# Patient Record
Sex: Male | Born: 1965
Health system: Southern US, Community
[De-identification: ages and names within clinical notes are randomized; demographics above are authoritative.]

## PROBLEM LIST (undated history)

## (undated) DIAGNOSIS — F329 Major depressive disorder, single episode, unspecified: Secondary | ICD-10-CM

## (undated) DIAGNOSIS — E1140 Type 2 diabetes mellitus with diabetic neuropathy, unspecified: Secondary | ICD-10-CM

## (undated) DIAGNOSIS — E785 Hyperlipidemia, unspecified: Secondary | ICD-10-CM

## (undated) DIAGNOSIS — E119 Type 2 diabetes mellitus without complications: Secondary | ICD-10-CM

## (undated) DIAGNOSIS — I10 Essential (primary) hypertension: Secondary | ICD-10-CM

## (undated) DIAGNOSIS — F419 Anxiety disorder, unspecified: Secondary | ICD-10-CM

## (undated) DIAGNOSIS — F32A Depression, unspecified: Secondary | ICD-10-CM

## (undated) HISTORY — DX: Hyperlipidemia, unspecified: E78.5

## (undated) HISTORY — DX: Type 2 diabetes mellitus with diabetic neuropathy, unspecified: E11.40

## (undated) HISTORY — DX: Major depressive disorder, single episode, unspecified: F32.9

## (undated) HISTORY — DX: Anxiety disorder, unspecified: F41.9

## (undated) HISTORY — DX: Depression, unspecified: F32.A

## (undated) HISTORY — DX: Type 2 diabetes mellitus without complications: E11.9

## (undated) HISTORY — DX: Essential (primary) hypertension: I10

---

## 2010-04-13 ENCOUNTER — Encounter
Admission: RE | Admit: 2010-04-13 | Discharge: 2010-04-13 | Payer: Self-pay | Source: Home / Self Care | Attending: Internal Medicine | Admitting: Internal Medicine

## 2013-04-10 ENCOUNTER — Ambulatory Visit: Payer: Self-pay | Admitting: *Deleted

## 2016-07-20 ENCOUNTER — Encounter: Payer: Self-pay | Admitting: Primary Care

## 2016-07-20 ENCOUNTER — Ambulatory Visit (INDEPENDENT_AMBULATORY_CARE_PROVIDER_SITE_OTHER): Payer: BLUE CROSS/BLUE SHIELD | Admitting: Primary Care

## 2016-07-20 VITALS — BP 134/74 | HR 93 | Temp 97.9°F | Ht 72.0 in | Wt 307.8 lb

## 2016-07-20 DIAGNOSIS — E785 Hyperlipidemia, unspecified: Secondary | ICD-10-CM | POA: Diagnosis not present

## 2016-07-20 DIAGNOSIS — I10 Essential (primary) hypertension: Secondary | ICD-10-CM | POA: Insufficient documentation

## 2016-07-20 DIAGNOSIS — M7500 Adhesive capsulitis of unspecified shoulder: Secondary | ICD-10-CM

## 2016-07-20 DIAGNOSIS — M7502 Adhesive capsulitis of left shoulder: Secondary | ICD-10-CM

## 2016-07-20 DIAGNOSIS — F329 Major depressive disorder, single episode, unspecified: Secondary | ICD-10-CM

## 2016-07-20 DIAGNOSIS — F419 Anxiety disorder, unspecified: Secondary | ICD-10-CM | POA: Diagnosis not present

## 2016-07-20 DIAGNOSIS — E119 Type 2 diabetes mellitus without complications: Secondary | ICD-10-CM

## 2016-07-20 DIAGNOSIS — K589 Irritable bowel syndrome without diarrhea: Secondary | ICD-10-CM | POA: Insufficient documentation

## 2016-07-20 DIAGNOSIS — K580 Irritable bowel syndrome with diarrhea: Secondary | ICD-10-CM | POA: Diagnosis not present

## 2016-07-20 DIAGNOSIS — F32A Depression, unspecified: Secondary | ICD-10-CM | POA: Insufficient documentation

## 2016-07-20 DIAGNOSIS — E1165 Type 2 diabetes mellitus with hyperglycemia: Secondary | ICD-10-CM | POA: Insufficient documentation

## 2016-07-20 HISTORY — DX: Adhesive capsulitis of unspecified shoulder: M75.00

## 2016-07-20 LAB — COMPREHENSIVE METABOLIC PANEL
ALBUMIN: 4.3 g/dL (ref 3.5–5.2)
ALK PHOS: 66 U/L (ref 39–117)
ALT: 26 U/L (ref 0–53)
AST: 17 U/L (ref 0–37)
BILIRUBIN TOTAL: 0.6 mg/dL (ref 0.2–1.2)
BUN: 17 mg/dL (ref 6–23)
CALCIUM: 9.1 mg/dL (ref 8.4–10.5)
CO2: 25 mEq/L (ref 19–32)
CREATININE: 0.92 mg/dL (ref 0.40–1.50)
Chloride: 101 mEq/L (ref 96–112)
GFR: 91.96 mL/min (ref >60.00)
Glucose, Bld: 332 mg/dL — ABNORMAL HIGH (ref 70–99)
Potassium: 3.9 mEq/L (ref 3.5–5.1)
SODIUM: 134 meq/L — AB (ref 135–145)
TOTAL PROTEIN: 7.4 g/dL (ref 6.0–8.3)

## 2016-07-20 LAB — LIPID PANEL
CHOLESTEROL: 170 mg/dL (ref 0–200)
HDL: 37.1 mg/dL — ABNORMAL LOW (ref >39.00)
NonHDL: 133.35
Total CHOL/HDL Ratio: 5
Triglycerides: 255 mg/dL — ABNORMAL HIGH (ref 0.0–149.0)
VLDL: 51 mg/dL — ABNORMAL HIGH (ref 0.0–40.0)

## 2016-07-20 LAB — HEMOGLOBIN A1C: HEMOGLOBIN A1C: 11.5 % — AB (ref 4.6–6.5)

## 2016-07-20 LAB — LDL CHOLESTEROL, DIRECT: LDL DIRECT: 90 mg/dL

## 2016-07-20 MED ORDER — FLUOXETINE HCL 40 MG PO CAPS: 40.0000 mg | ORAL_CAPSULE | Freq: Every day | ORAL | 3 refills | Status: DC

## 2016-07-20 MED ORDER — LISINOPRIL 10 MG PO TABS: 10.0000 mg | ORAL_TABLET | Freq: Every day | ORAL | 3 refills | Status: DC

## 2016-07-20 NOTE — Assessment & Plan Note (Signed)
Check A1C today, suspect to be above goal based off of readings. Will likely increase Metformin and add in Glipizide, may need to consider Lantus.

## 2016-07-20 NOTE — Progress Notes (Signed)
Pre visit review using our clinic review tool, if applicable. No additional management support is needed unless otherwise documented below in the visit note. 

## 2016-07-20 NOTE — Assessment & Plan Note (Signed)
Check lipids today. Will send in refill of simvastatin if lipids stable.

## 2016-07-20 NOTE — Progress Notes (Signed)
Subjective:    Patient ID: Scott Rice, male    DOB: [birth year], 51 y.o.   MRN: [id number]  HPI  [name] is a 51 year old male who presents today to establish care and discuss the problems mentioned below. Will obtain old records.  1) Anxiety and Depression: Currently managed on fluoxetine 40 mg, trazodone 50 mg as needed. He's also managed on Lamictal 100 mg that he's been taking as needed for anxiety during eye procedures for cataracts. He's not taking the Lamictal daily. This was prescribed by his prior PCP.  2) Essential Hypertension/Renal Protection: Currently managed on lisinopril 10 mg. His BP in the office is 134/74. He checks his blood pressure at home and gets readings of 130/80's.   3) Hyperlipidemia: Currently managed on Simvastatin 10 mg. His last cholesterol check was in December 2017 which was normal.  4) Type 2 Diabetes: Diagnosed several years ago. Currently managed on Metformin 500 mg BID. His last A1C was 9.2 in December 2017 which was a decrease from 11 several months prior. He's tried [place]Farxiza with improvement, but this was too costly. He was also managed on Victoza, and another injectable pen without improvement and/or intolerable side effects. Januvia was ineffective. He's never used insulin, glipizide.  He checks his sugars two-three times daily. His fasting sugars are running in the 250's, his post prandial sugars are running mid-high 300's. He had a cortisone injection was several weeks ago for frozen shoulder.  5) Shoulder Pain: Currently managed on Norco and Robaxin. [facility] frozen shoulder of his left side, Dr. Rennis ChrisSupple. He's been mostly taking Aleve and the methocarbamol, using Norco very sparingly.  6) IBS: Diarrhea type. Symptoms include generalized abdominal cramping with diarrhea that isn't always correlated to meals. Currently managed on Vibrizi which he has samples left over from his prior PCP's office. He's  noticed slight improvement with the Vibrizi. He had a colonoscopy in 2004.   Review of Systems  Constitutional: Negative for unexpected weight change.  Eyes: Negative for visual disturbance.  Respiratory: Negative for shortness of breath.   Cardiovascular: Negative for chest pain.  Gastrointestinal:       IBS  Endocrine: Negative for polyuria.  Musculoskeletal: Positive for arthralgias.  Skin: Negative for color change.  Neurological: Positive for numbness. Negative for dizziness and headaches.  Psychiatric/Behavioral: Negative for suicidal ideas.       Feels well managed on Prozac       No past medical history on file.   Social History   Social History  . Marital status: Married    Spouse name: N/A  . Number of children: N/A  . Years of education: N/A   Occupational History  . Not on file.   Social History Main Topics  . Smoking status: Former Games developermoker  . Smokeless tobacco: Never Used     Comment: quit 40 years ago  . Alcohol use No  . Drug use: No  . Sexual activity: Not on file   Other Topics Concern  . Not on file   Social History Narrative   Married.   No children.   Works as an Event organiserindependent contractor.   Enjoys watching movies, spending time with family.     No past surgical history on file.  No family history on file.  No Known Allergies  No current outpatient prescriptions on file prior to visit.   No current facility-administered medications on file prior to visit.     BP 134/74  Pulse 93   Temp 97.9 F (36.6 C) (Oral)   Ht 6' (1.829 m)   Wt (!) 307 lb 12 oz (139.6 kg)   SpO2 97%   BMI 41.74 kg/m    Objective:   Physical Exam  Constitutional: He is oriented to person, place, and time. He appears well-nourished.  Neck: Neck supple.  Cardiovascular: Normal rate and regular rhythm.   Pulmonary/Chest: Effort normal and breath sounds normal. He has no wheezes. He has no rales.  Musculoskeletal:       Left shoulder: He exhibits decreased  range of motion and pain.  Neurological: He is alert and oriented to person, place, and time.  Skin: Skin is warm and dry.  Psychiatric: He has a normal mood and affect.          Assessment & Plan:

## 2016-07-20 NOTE — Assessment & Plan Note (Signed)
Taking samples of Vibrizi with some improvement. Recommended colonoscopy given age and symptoms, he declines for now.

## 2016-07-20 NOTE — Assessment & Plan Note (Signed)
Stable on Prozac, uses Trazodone PRN for insomnia. Discussed to stop Lamictal as PRN use is not safe.

## 2016-07-20 NOTE — Assessment & Plan Note (Signed)
[facility]. Discussed to use Norco sparingly and that he will have to get refills from ortho if needed. Continue Robaxin and Aleve PRN.

## 2016-07-20 NOTE — Patient Instructions (Addendum)
Complete lab work prior to leaving today. I will notify you of your results once received.   I sent refills for Prozac and Lisinopril to your pharmacy. Stop Lamictal.  I will send Simvastatin and Metformin once I receive your lab results.  I'll be in touch regarding your next follow up visit.  It was a pleasure to meet you today! Please don't hesitate to call me with any questions. Welcome to [facility]!

## 2016-07-20 NOTE — Assessment & Plan Note (Signed)
Managed on lisinopril 10 mg, also for renal protection.  BP stable today, continue current regimen. Will get records.

## 2016-07-21 ENCOUNTER — Other Ambulatory Visit: Payer: Self-pay | Admitting: Primary Care

## 2016-07-21 DIAGNOSIS — E785 Hyperlipidemia, unspecified: Secondary | ICD-10-CM

## 2016-07-21 DIAGNOSIS — E119 Type 2 diabetes mellitus without complications: Secondary | ICD-10-CM

## 2016-07-21 MED ORDER — METFORMIN HCL 1000 MG PO TABS: 1000.0000 mg | ORAL_TABLET | Freq: Two times a day (BID) | ORAL | 3 refills | Status: DC

## 2016-07-21 MED ORDER — ATORVASTATIN CALCIUM 20 MG PO TABS: 20.0000 mg | ORAL_TABLET | Freq: Every evening | ORAL | 3 refills | Status: DC

## 2016-07-26 ENCOUNTER — Encounter: Payer: Self-pay | Admitting: Primary Care

## 2016-07-26 LAB — HM DIABETES EYE EXAM

## 2016-07-27 ENCOUNTER — Telehealth: Payer: Self-pay | Admitting: Primary Care

## 2016-07-27 NOTE — Telephone Encounter (Signed)
Pt returned your call.  

## 2016-07-27 NOTE — Telephone Encounter (Signed)
Spoken and notified patient of Kate's comments. Patient verbalized understanding. 

## 2016-07-28 ENCOUNTER — Telehealth: Payer: Self-pay | Admitting: Primary Care

## 2016-07-28 DIAGNOSIS — E119 Type 2 diabetes mellitus without complications: Secondary | ICD-10-CM

## 2016-07-28 NOTE — Telephone Encounter (Signed)
Patient returned [name]'s call. °

## 2016-07-29 MED ORDER — GLIPIZIDE 10 MG PO TABS: 10.0000 mg | ORAL_TABLET | Freq: Two times a day (BID) | ORAL | 0 refills | Status: DC

## 2016-07-29 NOTE — Telephone Encounter (Signed)
Message left for patient to return my call.  

## 2016-07-29 NOTE — Telephone Encounter (Signed)
Noted. Rx for Glipizide 10 mg sent into pharmacy. Take 1 tablet by mouth twice daily with a meal. Please have him call if he starts feeling shaky, weak, dizzy while on these pills. Have him notify us with blood sugar readings below 90 on a consistent basis.

## 2016-07-29 NOTE — Telephone Encounter (Signed)
Patient returned [name]'s call about lab results.  Scott Rice is off until Monday. I let patient know Scott Rice's comments.  Patient said he is agreeable to starting Glipizide and the lifestyle change.Patient uses [facility].  I let patient know Scott Rice is out of the office until Tuesday and he said that's fine.  Patient scheduled follow up with Scott Rice on 09/21/16.

## 2016-07-30 NOTE — Telephone Encounter (Signed)
Left detailed msg on VM per HIPAA  

## 2016-09-09 ENCOUNTER — Encounter (INDEPENDENT_AMBULATORY_CARE_PROVIDER_SITE_OTHER): Payer: Self-pay | Admitting: Ophthalmology

## 2016-09-14 ENCOUNTER — Encounter (INDEPENDENT_AMBULATORY_CARE_PROVIDER_SITE_OTHER): Payer: BLUE CROSS/BLUE SHIELD | Admitting: Ophthalmology

## 2016-09-14 DIAGNOSIS — E113392 Type 2 diabetes mellitus with moderate nonproliferative diabetic retinopathy without macular edema, left eye: Secondary | ICD-10-CM | POA: Diagnosis not present

## 2016-09-14 DIAGNOSIS — E11319 Type 2 diabetes mellitus with unspecified diabetic retinopathy without macular edema: Secondary | ICD-10-CM

## 2016-09-14 DIAGNOSIS — E113291 Type 2 diabetes mellitus with mild nonproliferative diabetic retinopathy without macular edema, right eye: Secondary | ICD-10-CM

## 2016-09-14 DIAGNOSIS — H35033 Hypertensive retinopathy, bilateral: Secondary | ICD-10-CM | POA: Diagnosis not present

## 2016-09-14 DIAGNOSIS — H353121 Nonexudative age-related macular degeneration, left eye, early dry stage: Secondary | ICD-10-CM

## 2016-09-14 DIAGNOSIS — I10 Essential (primary) hypertension: Secondary | ICD-10-CM

## 2016-09-14 DIAGNOSIS — H43813 Vitreous degeneration, bilateral: Secondary | ICD-10-CM

## 2016-09-21 ENCOUNTER — Encounter: Payer: Self-pay | Admitting: Primary Care

## 2016-09-21 ENCOUNTER — Ambulatory Visit (INDEPENDENT_AMBULATORY_CARE_PROVIDER_SITE_OTHER): Payer: BLUE CROSS/BLUE SHIELD | Admitting: Primary Care

## 2016-09-21 VITALS — BP 124/76 | HR 100 | Temp 97.8°F | Ht 72.0 in | Wt 306.0 lb

## 2016-09-21 DIAGNOSIS — E785 Hyperlipidemia, unspecified: Secondary | ICD-10-CM

## 2016-09-21 DIAGNOSIS — E119 Type 2 diabetes mellitus without complications: Secondary | ICD-10-CM | POA: Diagnosis not present

## 2016-09-21 LAB — LIPID PANEL
CHOL/HDL RATIO: 3
CHOLESTEROL: 107 mg/dL (ref 0–200)
HDL: 42.2 mg/dL (ref >39.00)
LDL CALC: 36 mg/dL (ref 0–99)
NonHDL: 64.53
Triglycerides: 144 mg/dL (ref 0.0–149.0)
VLDL: 28.8 mg/dL (ref 0.0–40.0)

## 2016-09-21 LAB — BASIC METABOLIC PANEL
BUN: 18 mg/dL (ref 6–23)
CO2: 22 mEq/L (ref 19–32)
Calcium: 9.8 mg/dL (ref 8.4–10.5)
Chloride: 101 mEq/L (ref 96–112)
Creatinine, Ser: 0.95 mg/dL (ref 0.40–1.50)
GFR: 88.56 mL/min (ref >60.00)
Glucose, Bld: 167 mg/dL — ABNORMAL HIGH (ref 70–99)
POTASSIUM: 4.1 meq/L (ref 3.5–5.1)
SODIUM: 137 meq/L (ref 135–145)

## 2016-09-21 MED ORDER — ONETOUCH ULTRASOFT LANCETS MISC: 5 refills | Status: DC

## 2016-09-21 MED ORDER — ONETOUCH ULTRA 2 W/DEVICE KIT: PACK | 0 refills | Status: DC

## 2016-09-21 MED ORDER — BLOOD GLUCOSE MONITOR KIT: PACK | 0 refills | Status: DC

## 2016-09-21 MED ORDER — GLUCOSE BLOOD VI STRP: ORAL_STRIP | 5 refills | Status: DC

## 2016-09-21 MED ORDER — BLOOD GLUCOSE METER KIT: PACK | 0 refills | Status: DC

## 2016-09-21 NOTE — Addendum Note (Signed)
Addended by: Tawnya CrookSAMBATH, Aris Moman on: 09/21/2016 11:21 AM   Modules accepted: Orders

## 2016-09-21 NOTE — Assessment & Plan Note (Signed)
Fasting sugars improved, not quite at goal. Will have him work on diet and exercise over the next month. A1C due in 1 month. If no improvement consider Lantus. If improving consider Jardiance.

## 2016-09-21 NOTE — Patient Instructions (Signed)
Complete lab work prior to leaving today. I will notify you of your results once received.   Continue to work on improving your diet.  Start exercising. You should be getting 150 minutes of moderate intensity exercise weekly.  Ensure you are consuming 64 ounces of water daily.  Schedule a lab only appointment in 1 month to recheck your A1C. Continue Glipizide and Metformin as prescribed.  It was a pleasure to see you today!  Diabetes Mellitus and Food It is important for you to manage your blood sugar (glucose) level. Your blood glucose level can be greatly affected by what you eat. Eating healthier foods in the appropriate amounts throughout the day at about the same time each day will help you control your blood glucose level. It can also help slow or prevent worsening of your diabetes mellitus. Healthy eating may even help you improve the level of your blood pressure and reach or maintain a healthy weight. General recommendations for healthful eating and cooking habits include:  Eating meals and snacks regularly. Avoid going long periods of time without eating to lose weight.  Eating a diet that consists mainly of plant-based foods, such as fruits, vegetables, nuts, legumes, and whole grains.  Using low-heat cooking methods, such as baking, instead of high-heat cooking methods, such as deep frying.  Work with your dietitian to make sure you understand how to use the Nutrition Facts information on food labels. How can food affect me? Carbohydrates Carbohydrates affect your blood glucose level more than any other type of food. Your dietitian will help you determine how many carbohydrates to eat at each meal and teach you how to count carbohydrates. Counting carbohydrates is important to keep your blood glucose at a healthy level, especially if you are using insulin or taking certain medicines for diabetes mellitus. Alcohol Alcohol can cause sudden decreases in blood glucose  (hypoglycemia), especially if you use insulin or take certain medicines for diabetes mellitus. Hypoglycemia can be a life-threatening condition. Symptoms of hypoglycemia (sleepiness, dizziness, and disorientation) are similar to symptoms of having too much alcohol. If your health care provider has given you approval to drink alcohol, do so in moderation and use the following guidelines:  Women should not have more than one drink per day, and men should not have more than two drinks per day. One drink is equal to: ? 12 oz of beer. ? 5 oz of wine. ? 1 oz of hard liquor.  Do not drink on an empty stomach.  Keep yourself hydrated. Have water, diet soda, or unsweetened iced tea.  Regular soda, juice, and other mixers might contain a lot of carbohydrates and should be counted.  What foods are not recommended? As you make food choices, it is important to remember that all foods are not the same. Some foods have fewer nutrients per serving than other foods, even though they might have the same number of calories or carbohydrates. It is difficult to get your body what it needs when you eat foods with fewer nutrients. Examples of foods that you should avoid that are high in calories and carbohydrates but low in nutrients include:  Trans fats (most processed foods list trans fats on the Nutrition Facts label).  Regular soda.  Juice.  Candy.  Sweets, such as cake, pie, doughnuts, and cookies.  Fried foods.  What foods can I eat? Eat nutrient-rich foods, which will nourish your body and keep you healthy. The food you should eat also will depend on several factors,  including:  The calories you need.  The medicines you take.  Your weight.  Your blood glucose level.  Your blood pressure level.  Your cholesterol level.  You should eat a variety of foods, including:  Protein. ? Lean cuts of meat. ? Proteins low in saturated fats, such as fish, egg whites, and beans. Avoid processed  meats.  Fruits and vegetables. ? Fruits and vegetables that may help control blood glucose levels, such as apples, mangoes, and yams.  Dairy products. ? Choose fat-free or low-fat dairy products, such as milk, yogurt, and cheese.  Grains, bread, pasta, and rice. ? Choose whole grain products, such as multigrain bread, whole oats, and brown rice. These foods may help control blood pressure.  Fats. ? Foods containing healthful fats, such as nuts, avocado, olive oil, canola oil, and fish.  Does everyone with diabetes mellitus have the same meal plan? Because every person with diabetes mellitus is different, there is not one meal plan that works for everyone. It is very important that you meet with a dietitian who will help you create a meal plan that is just right for you. This information is not intended to replace advice given to you by your health care provider. Make sure you discuss any questions you have with your health care provider. Document Released: 11/26/2004 Document Revised: 08/07/2015 Document Reviewed: 01/26/2013 Elsevier Interactive Patient Education  2017 [employer].

## 2016-09-21 NOTE — Addendum Note (Signed)
Addended by: Tawnya CrookSAMBATH, Shanteria Laye on: 09/21/2016 05:03 PM   Modules accepted: Orders

## 2016-09-21 NOTE — Progress Notes (Signed)
Subjective:    Patient ID: Scott Rice, male    DOB: [birth year], 51 y.o.   MRN: [id number]  HPI  Scott Rice is a 51 year old male who presents today for follow up.  1) Type 2 Diabetes: A1C of 11.5 on his last visit in early May 2018. Currently managed on Metformin 1000 mg BID and Glipizide 10 mg BID. It was recommended he start Lantus during his last visit but he kindly refused so Glipizide was added. His Metformin was increased to 1000 mg BID.   Since his last visit he's been checking his sugars several times daily. His fasting sugars are running 150-240. He saw his ophthalmologist in May 2018, no diabetic retinopathy. He's been compliant to his Metformin and Glipizide.  He's been working on improvements in his diet by eating baked lean protein, cucumbers, limiting fast food consumption. He recently started using an exercise bike at home.   Wt Readings from Last 3 Encounters:  09/21/16 (!) 306 lb (138.8 kg)  07/20/16 (!) 307 lb 12 oz (139.6 kg)      2) Hyperlipidemia: Previously managed on Simvastatin, now on atorvastatin 20 mg given lipid levels 6 weeks ago. He is due for recheck today and is fasting.     Review of Systems  Constitutional: Negative for fatigue.  Respiratory: Negative for shortness of breath.   Cardiovascular: Negative for chest pain.  Neurological: Negative for dizziness, weakness and numbness.       No past medical history on file.   Social History   Social History  . Marital status: Married    Spouse name: N/A  . Number of children: N/A  . Years of education: N/A   Occupational History  . Not on file.   Social History Main Topics  . Smoking status: Former Games developermoker  . Smokeless tobacco: Never Used     Comment: quit 40 years ago  . Alcohol use No  . Drug use: No  . Sexual activity: Not on file   Other Topics Concern  . Not on file   Social History Narrative   Married.   No children.   Works as an Event organiserindependent contractor.   Enjoys  watching movies, spending time with family.     No past surgical history on file.  No family history on file.  No Known Allergies  Current Outpatient Prescriptions on File Prior to Visit  Medication Sig Dispense Refill  . atorvastatin (LIPITOR) 20 MG tablet Take 1 tablet (20 mg total) by mouth every evening. 90 tablet 3  . B Complex-C (SUPER B COMPLEX PO) Take by mouth 2 (two) times daily.    . Calcium Carbonate-Vitamin D (CALCIUM 600/VITAMIN D PO) Take by mouth daily.    . Eluxadoline (VIBERZI) 100 MG TABS Take by mouth daily as needed.    Marland Kitchen. FLUoxetine (PROZAC) 40 MG capsule Take 1 capsule (40 mg total) by mouth daily. 90 capsule 3  . fluticasone (FLONASE) 50 MCG/ACT nasal spray Place into both nostrils daily as needed for allergies or rhinitis.    Marland Kitchen. glipiZIDE (GLUCOTROL) 10 MG tablet Take 1 tablet (10 mg total) by mouth 2 (two) times daily before a meal. 180 tablet 0  . HYDROcodone-acetaminophen (NORCO/VICODIN) 5-325 MG tablet Take 1 tablet by mouth daily as needed for moderate pain.    Marland Kitchen. lisinopril (PRINIVIL,ZESTRIL) 10 MG tablet Take 1 tablet (10 mg total) by mouth daily. 90 tablet 3  . metFORMIN (GLUCOPHAGE) 1000 MG tablet Take 1 tablet (1,000 mg total)  by mouth 2 (two) times daily with a meal. 180 tablet 3  . methocarbamol (ROBAXIN) 750 MG tablet Take 750 mg by mouth daily.    . traZODone (DESYREL) 50 MG tablet Take 50 mg by mouth at bedtime as needed for sleep.     No current facility-administered medications on file prior to visit.     BP 124/76   Pulse 100   Temp 97.8 F (36.6 C) (Oral)   Ht 6' (1.829 m)   Wt (!) 306 lb (138.8 kg)   SpO2 96%   BMI 41.50 kg/m    Objective:   Physical Exam  Constitutional: He appears well-nourished.  Neck: Neck supple.  Cardiovascular: Normal rate and regular rhythm.   Pulmonary/Chest: Effort normal and breath sounds normal.  Skin: Skin is warm and dry.  Psychiatric: He has a normal mood and affect.          Assessment &  Plan:

## 2016-09-21 NOTE — Assessment & Plan Note (Signed)
Fasting today, check lipids since switching from simvastatin to atorvastatin.

## 2016-09-23 ENCOUNTER — Telehealth: Payer: Self-pay | Admitting: Primary Care

## 2016-09-23 NOTE — Telephone Encounter (Signed)
Patient returned [name]'s call. °

## 2016-09-24 NOTE — Telephone Encounter (Signed)
Spoken and notified patient of Kate's comments. Patient verbalized understanding. 

## 2016-10-19 ENCOUNTER — Telehealth: Payer: Self-pay

## 2016-10-19 NOTE — Telephone Encounter (Signed)
Sounds good. Please schedule him for an office visit.

## 2016-10-19 NOTE — Telephone Encounter (Signed)
Pt left v/m; pt thinks side effects of glipizide; tiredness,discolored stools and SOB. Pt thinks he needs to come in for appt rather than just have blood drawn. Pt request cb.

## 2016-10-20 NOTE — Telephone Encounter (Signed)
Spoken to patient and change the appt on 10/26/2016 to an office visit.

## 2016-10-25 ENCOUNTER — Other Ambulatory Visit: Payer: Self-pay | Admitting: Primary Care

## 2016-10-25 DIAGNOSIS — E119 Type 2 diabetes mellitus without complications: Secondary | ICD-10-CM

## 2016-10-26 ENCOUNTER — Ambulatory Visit: Payer: BLUE CROSS/BLUE SHIELD | Admitting: Primary Care

## 2016-10-28 ENCOUNTER — Encounter: Payer: Self-pay | Admitting: Primary Care

## 2016-10-28 ENCOUNTER — Ambulatory Visit (INDEPENDENT_AMBULATORY_CARE_PROVIDER_SITE_OTHER): Payer: BLUE CROSS/BLUE SHIELD | Admitting: Primary Care

## 2016-10-28 VITALS — BP 120/72 | HR 91 | Temp 98.1°F | Ht 72.0 in | Wt 314.0 lb

## 2016-10-28 DIAGNOSIS — E119 Type 2 diabetes mellitus without complications: Secondary | ICD-10-CM | POA: Diagnosis not present

## 2016-10-28 DIAGNOSIS — Z23 Encounter for immunization: Secondary | ICD-10-CM | POA: Diagnosis not present

## 2016-10-28 LAB — HEMOGLOBIN A1C: HEMOGLOBIN A1C: 7.9 % — AB (ref 4.6–6.5)

## 2016-10-28 NOTE — Patient Instructions (Signed)
Complete lab work prior to leaving today. I will notify you of your results once received.   You were provided with an pneumonia vaccination today. We will repeat this in 5 years.  It is important that you improve your diet. Please limit carbohydrates in the form of white bread, rice, pasta, sweets, fast food, fried food, sugary drinks, etc. Increase your consumption of fresh fruits and vegetables, whole grains, lean protein.  Ensure you are consuming 64 ounces of water daily.  Continue exercising. You should be getting 150 minutes of moderate intensity exercise weekly.  I'll be in touch regarding your next follow up visit once we receive your A1C result . It was a pleasure to see you today!  Diabetes Mellitus and Food It is important for you to manage your blood sugar (glucose) level. Your blood glucose level can be greatly affected by what you eat. Eating healthier foods in the appropriate amounts throughout the day at about the same time each day will help you control your blood glucose level. It can also help slow or prevent worsening of your diabetes mellitus. Healthy eating may even help you improve the level of your blood pressure and reach or maintain a healthy weight. General recommendations for healthful eating and cooking habits include:  Eating meals and snacks regularly. Avoid going long periods of time without eating to lose weight.  Eating a diet that consists mainly of plant-based foods, such as fruits, vegetables, nuts, legumes, and whole grains.  Using low-heat cooking methods, such as baking, instead of high-heat cooking methods, such as deep frying.  Work with your dietitian to make sure you understand how to use the Nutrition Facts information on food labels. How can food affect me? Carbohydrates Carbohydrates affect your blood glucose level more than any other type of food. Your dietitian will help you determine how many carbohydrates to eat at each meal and teach you  how to count carbohydrates. Counting carbohydrates is important to keep your blood glucose at a healthy level, especially if you are using insulin or taking certain medicines for diabetes mellitus. Alcohol Alcohol can cause sudden decreases in blood glucose (hypoglycemia), especially if you use insulin or take certain medicines for diabetes mellitus. Hypoglycemia can be a life-threatening condition. Symptoms of hypoglycemia (sleepiness, dizziness, and disorientation) are similar to symptoms of having too much alcohol. If your health care provider has given you approval to drink alcohol, do so in moderation and use the following guidelines:  Women should not have more than one drink per day, and men should not have more than two drinks per day. One drink is equal to: ? 12 oz of beer. ? 5 oz of wine. ? 1 oz of hard liquor.  Do not drink on an empty stomach.  Keep yourself hydrated. Have water, diet soda, or unsweetened iced tea.  Regular soda, juice, and other mixers might contain a lot of carbohydrates and should be counted.  What foods are not recommended? As you make food choices, it is important to remember that all foods are not the same. Some foods have fewer nutrients per serving than other foods, even though they might have the same number of calories or carbohydrates. It is difficult to get your body what it needs when you eat foods with fewer nutrients. Examples of foods that you should avoid that are high in calories and carbohydrates but low in nutrients include:  Trans fats (most processed foods list trans fats on the Nutrition Facts label).  Regular  soda.  Juice.  Candy.  Sweets, such as cake, pie, doughnuts, and cookies.  Fried foods.  What foods can I eat? Eat nutrient-rich foods, which will nourish your body and keep you healthy. The food you should eat also will depend on several factors, including:  The calories you need.  The medicines you take.  Your  weight.  Your blood glucose level.  Your blood pressure level.  Your cholesterol level.  You should eat a variety of foods, including:  Protein. ? Lean cuts of meat. ? Proteins low in saturated fats, such as fish, egg whites, and beans. Avoid processed meats.  Fruits and vegetables. ? Fruits and vegetables that may help control blood glucose levels, such as apples, mangoes, and yams.  Dairy products. ? Choose fat-free or low-fat dairy products, such as milk, yogurt, and cheese.  Grains, bread, pasta, and rice. ? Choose whole grain products, such as multigrain bread, whole oats, and brown rice. These foods may help control blood pressure.  Fats. ? Foods containing healthful fats, such as nuts, avocado, olive oil, canola oil, and fish.  Does everyone with diabetes mellitus have the same meal plan? Because every person with diabetes mellitus is different, there is not one meal plan that works for everyone. It is very important that you meet with a dietitian who will help you create a meal plan that is just right for you. This information is not intended to replace advice given to you by your health care provider. Make sure you discuss any questions you have with your health care provider. Document Released: 11/26/2004 Document Revised: 08/07/2015 Document Reviewed: 01/26/2013 Elsevier Interactive Patient Education  2017 [employer]

## 2016-10-28 NOTE — Progress Notes (Signed)
Subjective:    Patient ID: Scott Rice, male    DOB: [birth year], 51 y.o.   MRN: [id number]  HPI  Scott Rice is a 51 year old male who presents today for follow up of diabetes.  Current medications include: Glipizide 10 mg BID, Metformin 1000 mg BID. He called our office last week reporting "side effects" including shortness of breath, fatigue, discolored stools. He attributed these effects to Glipizide. He stopped taking the Glipizide last week and has noticed symptoms have resolved. He has noticed decreased sensation to the feet as he stubbed his toe several weeks ago and didn't realize it until he saw bleeding.  He/She is checking his/her blood glucose 2 times daily and is getting readings of low 200's fasting with an average of 180 since coming off of Glipizide.   Last A1C: 11.5 in May 2018 Last Eye Exam: Negative in May 2018 Last Foot Exam:  Due, declines today. Pneumonia Vaccination: Never completed.  ACE/ARB: Lisinopril  Statin: Atorvastatin   Diet currently consists of:  Breakfast: Skips, works night shift Lunch: Best boy, salad Dinner: Best boy, pork, vegetables, rice, corn, broccoli with cheese, green beans Snacks: Vegetables, cheese, peanut butter Desserts: None Beverages: Powerade Zero, Diet Soda, Water  Exercise: He is exercising on his home stationary bike three days weekly for 20 minutes.      Review of Systems  Constitutional: Positive for fatigue.  Eyes: Negative for visual disturbance.  Respiratory: Negative for shortness of breath.   Cardiovascular: Negative for chest pain.  Skin: Negative for color change.  Neurological: Positive for numbness.       No past medical history on file.   Social History   Social History  . Marital status: Married    Spouse name: N/A  . Number of children: N/A  . Years of education: N/A   Occupational History  . Not on file.   Social History Main Topics  . Smoking status: Former Research scientist (life sciences)  .  Smokeless tobacco: Never Used     Comment: quit 40 years ago  . Alcohol use No  . Drug use: No  . Sexual activity: Not on file   Other Topics Concern  . Not on file   Social History Narrative   Married.   No children.   Works as an Chief Executive Officer.   Enjoys watching movies, spending time with family.     No past surgical history on file.  No family history on file.  No Known Allergies  Current Outpatient Prescriptions on File Prior to Visit  Medication Sig Dispense Refill  . atorvastatin (LIPITOR) 20 MG tablet Take 1 tablet (20 mg total) by mouth every evening. 90 tablet 3  . B Complex-C (SUPER B COMPLEX PO) Take by mouth 2 (two) times daily.    . blood glucose meter kit and supplies KIT Dispense based on patient and insurance preference.Use as instructed to test blood sugar 3 times daily. 1 each 0  . Calcium Carbonate-Vitamin D (CALCIUM 600/VITAMIN D PO) Take by mouth daily.    . Eluxadoline (VIBERZI) 100 MG TABS Take by mouth daily as needed.    Marland Kitchen FLUoxetine (PROZAC) 40 MG capsule Take 1 capsule (40 mg total) by mouth daily. 90 capsule 3  . fluticasone (FLONASE) 50 MCG/ACT nasal spray Place into both nostrils daily as needed for allergies or rhinitis.    Marland Kitchen HYDROcodone-acetaminophen (NORCO/VICODIN) 5-325 MG tablet Take 1 tablet by mouth daily as needed for moderate pain.    Marland Kitchen lisinopril (PRINIVIL,ZESTRIL)  10 MG tablet Take 1 tablet (10 mg total) by mouth daily. 90 tablet 3  . metFORMIN (GLUCOPHAGE) 1000 MG tablet Take 1 tablet (1,000 mg total) by mouth 2 (two) times daily with a meal. 180 tablet 3  . methocarbamol (ROBAXIN) 750 MG tablet Take 750 mg by mouth daily.    . traZODone (DESYREL) 50 MG tablet Take 50 mg by mouth at bedtime as needed for sleep.     No current facility-administered medications on file prior to visit.     BP 120/72   Pulse 91   Temp 98.1 F (36.7 C) (Oral)   Ht 6' (1.829 m)   Wt (!) 314 lb (142.4 kg)   SpO2 98%   BMI 42.59 kg/m     Objective:   Physical Exam  Constitutional: He appears well-nourished.  Neck: Neck supple.  Cardiovascular: Normal rate and regular rhythm.   Pulmonary/Chest: Effort normal and breath sounds normal.  Skin: Skin is warm and dry.  Patient refuses a foot exam today, did not evaluate toe nail or feet.          Assessment & Plan:

## 2016-10-28 NOTE — Assessment & Plan Note (Addendum)
Due for repeat A1C today. Long discussion today regarding importance of diabetic diet.  Could not tolerate Glipizide. Recommended Lantus if A1C above goal, do not believe that adding an oral tablet will make much of a difference in A1C reduction, discussed this with patient. If he is not agreeable then consider Jardiance.  Declines foot exam. Managed on ACE and statin. Pneumovax provided today.

## 2016-10-28 NOTE — Addendum Note (Signed)
Addended by: Tawnya CrookSAMBATH, Kynlee Koenigsberg on: 10/28/2016 12:46 PM   Modules accepted: Orders

## 2016-11-02 ENCOUNTER — Telehealth: Payer: Self-pay | Admitting: Primary Care

## 2016-11-02 NOTE — Telephone Encounter (Signed)
Spoken and notified patient of Kate's comments. Patient verbalized understanding. 

## 2016-11-02 NOTE — Telephone Encounter (Signed)
Pt returned your call and is requesting a cb °

## 2016-11-03 ENCOUNTER — Other Ambulatory Visit: Payer: Self-pay | Admitting: Primary Care

## 2016-11-03 DIAGNOSIS — E119 Type 2 diabetes mellitus without complications: Secondary | ICD-10-CM

## 2016-11-03 MED ORDER — SITAGLIPTIN PHOSPHATE 100 MG PO TABS: 100.0000 mg | ORAL_TABLET | Freq: Every day | ORAL | 1 refills | Status: DC

## 2017-02-01 ENCOUNTER — Encounter: Payer: Self-pay | Admitting: Primary Care

## 2017-02-01 ENCOUNTER — Ambulatory Visit (INDEPENDENT_AMBULATORY_CARE_PROVIDER_SITE_OTHER): Payer: Commercial Managed Care - PPO | Admitting: Primary Care

## 2017-02-01 DIAGNOSIS — E119 Type 2 diabetes mellitus without complications: Secondary | ICD-10-CM | POA: Diagnosis not present

## 2017-02-01 DIAGNOSIS — I10 Essential (primary) hypertension: Secondary | ICD-10-CM

## 2017-02-01 DIAGNOSIS — F329 Major depressive disorder, single episode, unspecified: Secondary | ICD-10-CM

## 2017-02-01 DIAGNOSIS — F419 Anxiety disorder, unspecified: Secondary | ICD-10-CM | POA: Diagnosis not present

## 2017-02-01 DIAGNOSIS — F32A Depression, unspecified: Secondary | ICD-10-CM

## 2017-02-01 DIAGNOSIS — E785 Hyperlipidemia, unspecified: Secondary | ICD-10-CM | POA: Diagnosis not present

## 2017-02-01 LAB — HEMOGLOBIN A1C: Hgb A1c MFr Bld: 9.1 % — ABNORMAL HIGH (ref 4.6–6.5)

## 2017-02-01 MED ORDER — ATORVASTATIN CALCIUM 20 MG PO TABS: 20.0000 mg | ORAL_TABLET | Freq: Every evening | ORAL | 3 refills | Status: DC

## 2017-02-01 MED ORDER — FLUOXETINE HCL 40 MG PO CAPS: 40.0000 mg | ORAL_CAPSULE | Freq: Every day | ORAL | 3 refills | Status: DC

## 2017-02-01 MED ORDER — METFORMIN HCL 1000 MG PO TABS: 1000.0000 mg | ORAL_TABLET | Freq: Two times a day (BID) | ORAL | 3 refills | Status: DC

## 2017-02-01 MED ORDER — LISINOPRIL 10 MG PO TABS: 10.0000 mg | ORAL_TABLET | Freq: Every day | ORAL | 3 refills | Status: DC

## 2017-02-01 MED ORDER — SITAGLIPTIN PHOSPHATE 100 MG PO TABS: 100.0000 mg | ORAL_TABLET | Freq: Every day | ORAL | 3 refills | Status: DC

## 2017-02-01 NOTE — Patient Instructions (Signed)
Complete lab work prior to leaving today. I will notify you of your results once received.   Continue to work on improving your diet.  Continue exercising. You should be getting 150 minutes of moderate intensity exercise weekly.  Schedule a follow up visit or physical in 6 months.  It was a pleasure to see you today!  Diabetes Mellitus and Food It is important for you to manage your blood sugar (glucose) level. Your blood glucose level can be greatly affected by what you eat. Eating healthier foods in the appropriate amounts throughout the day at about the same time each day will help you control your blood glucose level. It can also help slow or prevent worsening of your diabetes mellitus. Healthy eating may even help you improve the level of your blood pressure and reach or maintain a healthy weight. General recommendations for healthful eating and cooking habits include:  Eating meals and snacks regularly. Avoid going long periods of time without eating to lose weight.  Eating a diet that consists mainly of plant-based foods, such as fruits, vegetables, nuts, legumes, and whole grains.  Using low-heat cooking methods, such as baking, instead of high-heat cooking methods, such as deep frying.  Work with your dietitian to make sure you understand how to use the Nutrition Facts information on food labels. How can food affect me? Carbohydrates Carbohydrates affect your blood glucose level more than any other type of food. Your dietitian will help you determine how many carbohydrates to eat at each meal and teach you how to count carbohydrates. Counting carbohydrates is important to keep your blood glucose at a healthy level, especially if you are using insulin or taking certain medicines for diabetes mellitus. Alcohol Alcohol can cause sudden decreases in blood glucose (hypoglycemia), especially if you use insulin or take certain medicines for diabetes mellitus. Hypoglycemia can be a  life-threatening condition. Symptoms of hypoglycemia (sleepiness, dizziness, and disorientation) are similar to symptoms of having too much alcohol. If your health care provider has given you approval to drink alcohol, do so in moderation and use the following guidelines:  Women should not have more than one drink per day, and men should not have more than two drinks per day. One drink is equal to: ? 12 oz of beer. ? 5 oz of wine. ? 1 oz of hard liquor.  Do not drink on an empty stomach.  Keep yourself hydrated. Have water, diet soda, or unsweetened iced tea.  Regular soda, juice, and other mixers might contain a lot of carbohydrates and should be counted.  What foods are not recommended? As you make food choices, it is important to remember that all foods are not the same. Some foods have fewer nutrients per serving than other foods, even though they might have the same number of calories or carbohydrates. It is difficult to get your body what it needs when you eat foods with fewer nutrients. Examples of foods that you should avoid that are high in calories and carbohydrates but low in nutrients include:  Trans fats (most processed foods list trans fats on the Nutrition Facts label).  Regular soda.  Juice.  Candy.  Sweets, such as cake, pie, doughnuts, and cookies.  Fried foods.  What foods can I eat? Eat nutrient-rich foods, which will nourish your body and keep you healthy. The food you should eat also will depend on several factors, including:  The calories you need.  The medicines you take.  Your weight.  Your blood glucose  level.  Your blood pressure level.  Your cholesterol level.  You should eat a variety of foods, including:  Protein. ? Lean cuts of meat. ? Proteins low in saturated fats, such as fish, egg whites, and beans. Avoid processed meats.  Fruits and vegetables. ? Fruits and vegetables that may help control blood glucose levels, such as apples,  mangoes, and yams.  Dairy products. ? Choose fat-free or low-fat dairy products, such as milk, yogurt, and cheese.  Grains, bread, pasta, and rice. ? Choose whole grain products, such as multigrain bread, whole oats, and brown rice. These foods may help control blood pressure.  Fats. ? Foods containing healthful fats, such as nuts, avocado, olive oil, canola oil, and fish.  Does everyone with diabetes mellitus have the same meal plan? Because every person with diabetes mellitus is different, there is not one meal plan that works for everyone. It is very important that you meet with a dietitian who will help you create a meal plan that is just right for you. This information is not intended to replace advice given to you by your health care provider. Make sure you discuss any questions you have with your health care provider. Document Released: 11/26/2004 Document Revised: 08/07/2015 Document Reviewed: 01/26/2013 Elsevier Interactive Patient Education  2017 [employer]

## 2017-02-01 NOTE — Assessment & Plan Note (Signed)
Fasting sugars around 150 which is an improvement. Repeat A1C today. Foot exam updated today. Recommended to increase exercise, commended improvement in diet. Follow up in 6 months.

## 2017-02-01 NOTE — Progress Notes (Signed)
Subjective:    Patient ID: Scott Rice, male    DOB: 10-Aug-1965, 51 y.o.   MRN: [id number]  HPI  Scott Rice is a 51 year old male who presents today for follow up.  1) Type 2 Diabetes:   Current medications include: Januvia 100 mg, metformin 1000 mg BID.  He is checking his blood glucose once daily before work at 5:30 am.  He is getting readings of 150's.   Last A1C: 7.9 Last Eye Exam: Completed in May 2018 Last Foot Exam: Due Pneumonia Vaccination: Completed pneumovax in 2018 ACE/ARB: ACE Statin: Atorvastatin   Diet currently consists of:  Breakfast: Skips Lunch: Salad with veggies and protein, baked chicken Dinner: Hamburgers, baked chicken, some fish, broccoli  Snacks: Fruit, nuts, pretzels Desserts: None Beverages: Water, diet soda, unsweet tea, Powerade Zero.  Exercise: He is walking 4-5 times weekly for about 1-3 miles  Wt Readings from Last 3 Encounters:  02/01/17 (!) 301 lb 6.4 oz (136.7 kg)  10/28/16 (!) 314 lb (142.4 kg)  09/21/16 (!) 306 lb (138.8 kg)      2) Hyperlipidemia: Currently managed on atorvastatin 20 mg. Lipid panel in July 2018 with TC of 107, LDL of 36, Trigs of 144.   3) Essential Hypertension: Currently managed on lisinopril 10 mg. He denies chest pain, dizziness, lower extremity edema.  BP Readings from Last 3 Encounters:  02/01/17 120/72  10/28/16 120/72  09/21/16 124/76    4) Anxiety and Depression: Currently managed on fluoxetine 40 mg. Overall doing well, did have to take a "double dose" when his dog died several months ago. Now back down to the original dose. He denies SI/HI.   Review of Systems  Constitutional: Negative for fatigue.  Respiratory: Negative for shortness of breath.   Cardiovascular: Negative for chest pain.  Psychiatric/Behavioral:       Feels well managed on fluoxetine.        No past medical history on file.   Social History   Socioeconomic History  . Marital status: Married    Spouse  name: Not on file  . Number of children: Not on file  . Years of education: Not on file  . Highest education level: Not on file  Social Needs  . Financial resource strain: Not on file  . Food insecurity - worry: Not on file  . Food insecurity - inability: Not on file  . Transportation needs - medical: Not on file  . Transportation needs - non-medical: Not on file  Occupational History  . Not on file  Tobacco Use  . Smoking status: Former Games developermoker  . Smokeless tobacco: Never Used  . Tobacco comment: quit 40 years ago  Substance and Sexual Activity  . Alcohol use: No  . Drug use: No  . Sexual activity: Not on file  Other Topics Concern  . Not on file  Social History Narrative   Married.   No children.   Works as an Event organiserindependent contractor.   Enjoys watching movies, spending time with family.     No past surgical history on file.  No family history on file.  No Known Allergies  Current Outpatient Medications on File Prior to Visit  Medication Sig Dispense Refill  . B Complex-C (SUPER B COMPLEX PO) Take by mouth 2 (two) times daily.    . Calcium Carbonate-Vitamin D (CALCIUM 600/VITAMIN D PO) Take by mouth daily.    . Eluxadoline (VIBERZI) 100 MG TABS Take by mouth daily as needed.    .Marland Kitchen  fluticasone (FLONASE) 50 MCG/ACT nasal spray Place into both nostrils daily as needed for allergies or rhinitis.    . methocarbamol (ROBAXIN) 750 MG tablet Take 750 mg by mouth daily.    . traZODone (DESYREL) 50 MG tablet Take 50 mg by mouth at bedtime as needed for sleep.     No current facility-administered medications on file prior to visit.     BP 120/72   Pulse 83   Temp 98.1 F (36.7 C) (Oral)   Ht 6' (1.829 m)   Wt (!) 301 lb 6.4 oz (136.7 kg)   SpO2 98%   BMI 40.88 kg/m    Objective:   Physical Exam  Constitutional: He appears well-nourished.  Neck: Neck supple.  Cardiovascular: Normal rate and regular rhythm.  Pulmonary/Chest: Effort normal and breath sounds normal.    Skin: Skin is warm and dry.  Psychiatric: He has a normal mood and affect.          Assessment & Plan:

## 2017-02-01 NOTE — Assessment & Plan Note (Signed)
Doing well on current regimen.  Refill sent to pharmacy.

## 2017-02-01 NOTE — Assessment & Plan Note (Signed)
Lipids stable from July 2018, continue atorvastatin.

## 2017-02-11 ENCOUNTER — Telehealth: Payer: Self-pay | Admitting: Primary Care

## 2017-02-11 DIAGNOSIS — E119 Type 2 diabetes mellitus without complications: Secondary | ICD-10-CM

## 2017-02-11 NOTE — Telephone Encounter (Signed)
Patient agreeing to new med for DM.

## 2017-02-11 NOTE — Telephone Encounter (Signed)
Patient would like to try the pill first.

## 2017-02-11 NOTE — Telephone Encounter (Signed)
Copied from CRM [phone]. Topic: Quick Communication - See Telephone Encounter >> Feb 11, 2017 10:00 AM Oneal GroutSebastian, Jennifer S wrote: CRM for notification. See Telephone encounter for:  Patient is agreeable to start new med, not sure of name, for DM 02/11/17.

## 2017-02-11 NOTE — Telephone Encounter (Signed)
Please inquire. Which medication is he agreeable to? Invokana or Insulin? Please review result note from 02/01/17.

## 2017-02-12 MED ORDER — CANAGLIFLOZIN 100 MG PO TABS: 100.0000 mg | ORAL_TABLET | Freq: Every day | ORAL | 0 refills | Status: DC

## 2017-02-12 NOTE — Telephone Encounter (Signed)
Noted. Rx for canagliflozin 100 mg tablets sent to pharmacy. Notify patient that this will cause increased urination. Needs a BMP in one month, needs to be fasting. Please set up. Also needs repeat A1C in 3 months, please schedule a lab only appointment for that time. We will see him in May as scheduled.

## 2017-02-14 NOTE — Telephone Encounter (Signed)
Per DPR, left detail message of Kate's comments for patient to call back. 

## 2017-02-16 NOTE — Telephone Encounter (Signed)
Per DPR, left detail message of Kate's comments for patient to call back. 

## 2017-02-28 NOTE — Telephone Encounter (Signed)
Sending letter with Betti CruzKate's comments and to remind patient to schedule lab appointments.

## 2017-03-24 ENCOUNTER — Ambulatory Visit (INDEPENDENT_AMBULATORY_CARE_PROVIDER_SITE_OTHER): Payer: BLUE CROSS/BLUE SHIELD | Admitting: Ophthalmology

## 2017-04-11 ENCOUNTER — Encounter: Payer: Self-pay | Admitting: Primary Care

## 2017-04-11 ENCOUNTER — Ambulatory Visit (INDEPENDENT_AMBULATORY_CARE_PROVIDER_SITE_OTHER): Payer: Commercial Managed Care - PPO | Admitting: Primary Care

## 2017-04-11 VITALS — BP 124/76 | HR 92 | Temp 98.1°F | Ht 72.0 in | Wt 307.1 lb

## 2017-04-11 DIAGNOSIS — M25562 Pain in left knee: Secondary | ICD-10-CM

## 2017-04-11 DIAGNOSIS — E119 Type 2 diabetes mellitus without complications: Secondary | ICD-10-CM | POA: Diagnosis not present

## 2017-04-11 MED ORDER — GLIPIZIDE ER 10 MG PO TB24: 10.0000 mg | ORAL_TABLET | Freq: Every day | ORAL | 1 refills | Status: DC

## 2017-04-11 MED ORDER — NAPROXEN 500 MG PO TABS
500.0000 mg | ORAL_TABLET | Freq: Two times a day (BID) | ORAL | 0 refills | Status: DC
Start: 2017-04-11 — End: 2017-11-08

## 2017-04-11 NOTE — Progress Notes (Signed)
Subjective:    Patient ID: Scott Rice, male    DOB: 1966-03-09, 52 y.o.   MRN: [id number]  HPI  Scott Rice is a 52 year old male with a history of torn ACL (teenager), obesity who presents today with a chief complaint of knee pain.   His pain is located to the left lateral knee has been present since slipping (without falling) on ice 2 days ago. Since the incident he's been on his knee trying to work, but had to stop due to continued pain. He's been applying and elevating the knee without much improvement.   He denies weakness, his knee giving out. He does experience pain with putting pressure onto his knee and also when bending his knee while sleeping. He's been taking Extra Strength Tylenol and Ibuprofen intermittently with improvement.   Review of Systems  Musculoskeletal:       Left knee pain  Skin: Negative for color change.  Neurological: Negative for weakness and numbness.       No past medical history on file.   Social History   Socioeconomic History  . Marital status: Married    Spouse name: Not on file  . Number of children: Not on file  . Years of education: Not on file  . Highest education level: Not on file  Social Needs  . Financial resource strain: Not on file  . Food insecurity - worry: Not on file  . Food insecurity - inability: Not on file  . Transportation needs - medical: Not on file  . Transportation needs - non-medical: Not on file  Occupational History  . Not on file  Tobacco Use  . Smoking status: Former Games developermoker  . Smokeless tobacco: Never Used  . Tobacco comment: quit 40 years ago  Substance and Sexual Activity  . Alcohol use: No  . Drug use: No  . Sexual activity: Not on file  Other Topics Concern  . Not on file  Social History Narrative   Married.   No children.   Works as an Event organiserindependent contractor.   Enjoys watching movies, spending time with family.     No family history on file.  No Known Allergies  Current Outpatient  Medications on File Prior to Visit  Medication Sig Dispense Refill  . atorvastatin (LIPITOR) 20 MG tablet Take 1 tablet (20 mg total) by mouth every evening. 90 tablet 3  . B Complex-C (SUPER B COMPLEX PO) Take by mouth 2 (two) times daily.    . Calcium Carbonate-Vitamin D (CALCIUM 600/VITAMIN D PO) Take by mouth daily.    . Eluxadoline (VIBERZI) 100 MG TABS Take by mouth daily as needed.    Marland Kitchen. FLUoxetine (PROZAC) 40 MG capsule Take 1 capsule (40 mg total) by mouth daily. 90 capsule 3  . fluticasone (FLONASE) 50 MCG/ACT nasal spray Place into both nostrils daily as needed for allergies or rhinitis.    Marland Kitchen. lisinopril (PRINIVIL,ZESTRIL) 10 MG tablet Take 1 tablet (10 mg total) by mouth daily. 90 tablet 3  . metFORMIN (GLUCOPHAGE) 1000 MG tablet Take 1 tablet (1,000 mg total) by mouth 2 (two) times daily with a meal. 180 tablet 3  . methocarbamol (ROBAXIN) 750 MG tablet Take 750 mg by mouth daily.    . sitaGLIPtin (JANUVIA) 100 MG tablet Take 1 tablet (100 mg total) by mouth daily. 90 tablet 3  . traZODone (DESYREL) 50 MG tablet Take 50 mg by mouth at bedtime as needed for sleep.    . canagliflozin (INVOKANA) 100  MG TABS tablet Take 1 tablet (100 mg total) by mouth daily before breakfast. (Patient not taking: Reported on 04/11/2017) 90 tablet 0   No current facility-administered medications on file prior to visit.     BP 124/76   Pulse 92   Temp 98.1 F (36.7 C) (Oral)   Ht 6' (1.829 m)   Wt (!) 307 lb 1.9 oz (139.3 kg)   SpO2 98%   BMI 41.65 kg/m    Objective:   Physical Exam  Constitutional: He appears well-nourished.  Cardiovascular: Normal rate.  Pulmonary/Chest: Effort normal.  Musculoskeletal:       Left knee: He exhibits normal range of motion, no swelling, no deformity, no erythema and no LCL laxity. Tenderness found. Lateral joint line and LCL tenderness noted.       Legs: Skin: Skin is warm and dry. No erythema.          Assessment & Plan:  Acute Knee Pain:  Since  slipping (not falling) on ice 2 days ago. Exam today without evidence of deformity or torn ligament, he can bare weight. Will have him purchase knee sleeve for support, rest, ice, elevate. Rx for Naproxen course sent to pharmacy, okay to alternate with Tylenol. Knee exercises/stretching provided. Work note provided. Consider imaging/PT if no improvement.  Doreene Nest, NP

## 2017-04-11 NOTE — Patient Instructions (Signed)
Start naproxen 500 mg twice daily with a snack.  Okay to take Tylenol in between if needed, don't exceed 3000 mg in 24 hours.  Purchase a knee sleeve/brace for support to the knee.  Continue to elevate and ice your knee if this helps.  Work hard on [employer].   Tell the ladies to schedule you for a lab appointment after February 20th and then an office visit 1-2 days later for diabetes check.  It was a pleasure to see you today!   Knee Exercises Ask your health care provider which exercises are safe for you. Do exercises exactly as told by your health care provider and adjust them as directed. It is normal to feel mild stretching, pulling, tightness, or discomfort as you do these exercises, but you should stop right away if you feel sudden pain or your pain gets worse.Do not begin these exercises until told by your health care provider. STRETCHING AND RANGE OF MOTION EXERCISES These exercises warm up your muscles and joints and improve the movement and flexibility of your knee. These exercises also help to relieve pain, numbness, and tingling. Exercise A: Knee Extension, Prone 1. Lie on your abdomen on a bed. 2. Place your left / right knee just beyond the edge of the surface so your knee is not on the bed. You can put a towel under your left / right thigh just above your knee for comfort. 3. Relax your leg muscles and allow gravity to straighten your knee. You should feel a stretch behind your left / right knee. 4. Hold this position for __________ seconds. 5. Scoot up so your knee is supported between repetitions. Repeat __________ times. Complete this stretch __________ times a day. Exercise B: Knee Flexion, Active  1. Lie on your back with both knees straight. If this causes back discomfort, bend your left / right knee so your foot is flat on the floor. 2. Slowly slide your left / right heel back toward your buttocks until you feel a gentle stretch in the front of your knee or  thigh. 3. Hold this position for __________ seconds. 4. Slowly slide your left / right heel back to the starting position. Repeat __________ times. Complete this exercise __________ times a day. Exercise C: Quadriceps, Prone  1. Lie on your abdomen on a firm surface, such as a bed or padded floor. 2. Bend your left / right knee and hold your ankle. If you cannot reach your ankle or pant leg, loop a belt around your foot and grab the belt instead. 3. Gently pull your heel toward your buttocks. Your knee should not slide out to the side. You should feel a stretch in the front of your thigh and knee. 4. Hold this position for __________ seconds. Repeat __________ times. Complete this stretch __________ times a day. Exercise D: Hamstring, Supine 1. Lie on your back. 2. Loop a belt or towel over the ball of your left / right foot. The ball of your foot is on the walking surface, right under your toes. 3. Straighten your left / right knee and slowly pull on the belt to raise your leg until you feel a gentle stretch behind your knee. ? Do not let your left / right knee bend while you do this. ? Keep your other leg flat on the floor. 4. Hold this position for __________ seconds. Repeat __________ times. Complete this stretch __________ times a day. STRENGTHENING EXERCISES These exercises build strength and endurance in your knee.  Endurance is the ability to use your muscles for a long time, even after they get tired. Exercise E: Quadriceps, Isometric  1. Lie on your back with your left / right leg extended and your other knee bent. Put a rolled towel or small pillow under your knee if told by your health care provider. 2. Slowly tense the muscles in the front of your left / right thigh. You should see your kneecap slide up toward your hip or see increased dimpling just above the knee. This motion will push the back of the knee toward the floor. 3. For __________ seconds, keep the muscle as tight as  you can without increasing your pain. 4. Relax the muscles slowly and completely. Repeat __________ times. Complete this exercise __________ times a day. Exercise F: Straight Leg Raises - Quadriceps 1. Lie on your back with your left / right leg extended and your other knee bent. 2. Tense the muscles in the front of your left / right thigh. You should see your kneecap slide up or see increased dimpling just above the knee. Your thigh may even shake a bit. 3. Keep these muscles tight as you raise your leg 4-6 inches (10-15 cm) off the floor. Do not let your knee bend. 4. Hold this position for __________ seconds. 5. Keep these muscles tense as you lower your leg. 6. Relax your muscles slowly and completely after each repetition. Repeat __________ times. Complete this exercise __________ times a day. Exercise G: Hamstring, Isometric 1. Lie on your back on a firm surface. 2. Bend your left / right knee approximately __________ degrees. 3. Dig your left / right heel into the surface as if you are trying to pull it toward your buttocks. Tighten the muscles in the back of your thighs to dig as hard as you can without increasing any pain. 4. Hold this position for __________ seconds. 5. Release the tension gradually and allow your muscles to relax completely for __________ seconds after each repetition. Repeat __________ times. Complete this exercise __________ times a day. Exercise H: Hamstring Curls  If told by your health care provider, do this exercise while wearing ankle weights. Begin with __________ weights. Then increase the weight by 1 lb (0.5 kg) increments. Do not wear ankle weights that are more than __________. 1. Lie on your abdomen with your legs straight. 2. Bend your left / right knee as far as you can without feeling pain. Keep your hips flat against the floor. 3. Hold this position for __________ seconds. 4. Slowly lower your leg to the starting position.  Repeat __________  times. Complete this exercise __________ times a day. Exercise I: Squats (Quadriceps) 1. Stand in front of a table, with your feet and knees pointing straight ahead. You may rest your hands on the table for balance but not for support. 2. Slowly bend your knees and lower your hips like you are going to sit in a chair. ? Keep your weight over your heels, not over your toes. ? Keep your lower legs upright so they are parallel with the table legs. ? Do not let your hips go lower than your knees. ? Do not bend lower than told by your health care provider. ? If your knee pain increases, do not bend as low. 3. Hold the squat position for __________ seconds. 4. Slowly push with your legs to return to standing. Do not use your hands to pull yourself to standing. Repeat __________ times. Complete this exercise __________ times  a day. Exercise J: Wall Slides (Quadriceps)  1. Lean your back against a smooth wall or door while you walk your feet out 18-24 inches (46-61 cm) from it. 2. Place your feet hip-width apart. 3. Slowly slide down the wall or door until your knees bend __________ degrees. Keep your knees over your heels, not over your toes. Keep your knees in line with your hips. 4. Hold for __________ seconds. Repeat __________ times. Complete this exercise __________ times a day. Exercise K: Straight Leg Raises - Hip Abductors 1. Lie on your side with your left / right leg in the top position. Lie so your head, shoulder, knee, and hip line up. You may bend your bottom knee to help you keep your balance. 2. Roll your hips slightly forward so your hips are stacked directly over each other and your left / right knee is facing forward. 3. Leading with your heel, lift your top leg 4-6 inches (10-15 cm). You should feel the muscles in your outer hip lifting. ? Do not let your foot drift forward. ? Do not let your knee roll toward the ceiling. 4. Hold this position for __________ seconds. 5. Slowly  return your leg to the starting position. 6. Let your muscles relax completely after each repetition. Repeat __________ times. Complete this exercise __________ times a day. Exercise L: Straight Leg Raises - Hip Extensors 1. Lie on your abdomen on a firm surface. You can put a pillow under your hips if that is more comfortable. 2. Tense the muscles in your buttocks and lift your left / right leg about 4-6 inches (10-15 cm). Keep your knee straight as you lift your leg. 3. Hold this position for __________ seconds. 4. Slowly lower your leg to the starting position. 5. Let your leg relax completely after each repetition. Repeat __________ times. Complete this exercise __________ times a day. This information is not intended to replace advice given to you by your health care provider. Make sure you discuss any questions you have with your health care provider. Document Released: 01/13/2005 Document Revised: 11/24/2015 Document Reviewed: 01/05/2015 Elsevier Interactive Patient Education  2018 [employer]

## 2017-04-11 NOTE — Assessment & Plan Note (Signed)
Could not afford Invokana, would like to retry Glipizide. Rx sent to pharmacy.  Discussed the importance of a healthy diet and regular exercise in order for weight loss, and to reduce the risk of any potential medical problems.  Follow up in 1 month for repeat A1C.

## 2017-04-11 NOTE — Addendum Note (Signed)
Addended by: Doreene NestLARK, Reade Trefz K on: 04/11/2017 11:15 AM   Modules accepted: Orders

## 2017-05-11 ENCOUNTER — Other Ambulatory Visit: Payer: Self-pay | Admitting: Primary Care

## 2017-05-11 ENCOUNTER — Ambulatory Visit (INDEPENDENT_AMBULATORY_CARE_PROVIDER_SITE_OTHER): Payer: Commercial Managed Care - PPO | Admitting: Primary Care

## 2017-05-11 ENCOUNTER — Encounter: Payer: Self-pay | Admitting: Primary Care

## 2017-05-11 VITALS — BP 124/74 | HR 101 | Temp 98.0°F | Ht 72.0 in | Wt 320.8 lb

## 2017-05-11 DIAGNOSIS — E119 Type 2 diabetes mellitus without complications: Secondary | ICD-10-CM

## 2017-05-11 LAB — HEMOGLOBIN A1C: HEMOGLOBIN A1C: 9.5 % — AB (ref 4.6–6.5)

## 2017-05-11 LAB — COMPREHENSIVE METABOLIC PANEL
ALBUMIN: 3.9 g/dL (ref 3.5–5.2)
ALT: 27 U/L (ref 0–53)
AST: 20 U/L (ref 0–37)
Alkaline Phosphatase: 74 U/L (ref 39–117)
BILIRUBIN TOTAL: 0.5 mg/dL (ref 0.2–1.2)
BUN: 20 mg/dL (ref 6–23)
CALCIUM: 10 mg/dL (ref 8.4–10.5)
CO2: 27 mEq/L (ref 19–32)
CREATININE: 0.94 mg/dL (ref 0.40–1.50)
Chloride: 101 mEq/L (ref 96–112)
GFR: 89.42 mL/min (ref >60.00)
Glucose, Bld: 286 mg/dL — ABNORMAL HIGH (ref 70–99)
Potassium: 4.5 mEq/L (ref 3.5–5.1)
SODIUM: 136 meq/L (ref 135–145)
Total Protein: 7.1 g/dL (ref 6.0–8.3)

## 2017-05-11 MED ORDER — "INSULIN SYRINGE 30G X 5/16"" 1 ML MISC": 5 refills | Status: DC

## 2017-05-11 MED ORDER — INSULIN NPH ISOPHANE & REGULAR (70-30) 100 UNIT/ML ~~LOC~~ SUSP: SUBCUTANEOUS | 0 refills | Status: DC

## 2017-05-11 NOTE — Progress Notes (Signed)
Subjective:    Patient ID: Scott Rice, male    DOB: 24-Mar-1965, 52 y.o.   MRN: [id number]  HPI  Scott Rice is a 52 year old male who presents today for follow up of diabetes.  Current medications include: Metformin 1000 mg BID, glipizide XL 10 mg, Januvia 100 mg. He just lost his job and will not be able to afford his medications, especially the Januvia.  He is checking his blood glucose 0-3 times daily and is getting readings of: Bedtime: 150's AM Fasting: 140's Sometimes 2 hours a meal: 300's  Last A1C: 9.1 in November 2018 Last Eye Exam: Completed in May 2019 Last Foot Exam: Completed  Pneumonia Vaccination: Completed in 2018 ACE/ARB: Lisinopril  Statin: Atorvastatin  Diet currently consists of:  Breakfast: Skips Lunch: Baked chicken with sugary marinade, pork, rice, some corn Dinner: Baked chicken, pork, some vegetables, rice, salad, [place] Snacks: Nuts, veggies with cheese Desserts: None Beverages: Some water, unsweet tea, diet soda, some juice  Exercise: Not currently exercising.        Review of Systems  Constitutional: Negative for unexpected weight change.  Respiratory: Negative for shortness of breath.   Cardiovascular: Negative for chest pain.  Neurological: Negative for dizziness, weakness and numbness.       No past medical history on file.   Social History   Socioeconomic History  . Marital status: Married    Spouse name: Not on file  . Number of children: Not on file  . Years of education: Not on file  . Highest education level: Not on file  Social Needs  . Financial resource strain: Not on file  . Food insecurity - worry: Not on file  . Food insecurity - inability: Not on file  . Transportation needs - medical: Not on file  . Transportation needs - non-medical: Not on file  Occupational History  . Not on file  Tobacco Use  . Smoking status: Former Games developer  . Smokeless tobacco: Never Used  . Tobacco comment: quit 40 years ago   Substance and Sexual Activity  . Alcohol use: No  . Drug use: No  . Sexual activity: Not on file  Other Topics Concern  . Not on file  Social History Narrative   Married.   No children.   Works as an Event organiser.   Enjoys watching movies, spending time with family.      No family history on file.  No Known Allergies  Current Outpatient Medications on File Prior to Visit  Medication Sig Dispense Refill  . atorvastatin (LIPITOR) 20 MG tablet Take 1 tablet (20 mg total) by mouth every evening. 90 tablet 3  . B Complex-C (SUPER B COMPLEX PO) Take by mouth 2 (two) times daily.    . Calcium Carbonate-Vitamin D (CALCIUM 600/VITAMIN D PO) Take by mouth daily.    . Eluxadoline (VIBERZI) 100 MG TABS Take by mouth daily as needed.    Marland Kitchen FLUoxetine (PROZAC) 40 MG capsule Take 1 capsule (40 mg total) by mouth daily. 90 capsule 3  . fluticasone (FLONASE) 50 MCG/ACT nasal spray Place into both nostrils daily as needed for allergies or rhinitis.    Marland Kitchen glipiZIDE (GLUCOTROL XL) 10 MG 24 hr tablet Take 1 tablet (10 mg total) by mouth daily with breakfast. 30 tablet 1  . lisinopril (PRINIVIL,ZESTRIL) 10 MG tablet Take 1 tablet (10 mg total) by mouth daily. 90 tablet 3  . metFORMIN (GLUCOPHAGE) 1000 MG tablet Take 1 tablet (1,000 mg total) by  mouth 2 (two) times daily with a meal. 180 tablet 3  . methocarbamol (ROBAXIN) 750 MG tablet Take 750 mg by mouth daily.    . naproxen (NAPROSYN) 500 MG tablet Take 1 tablet (500 mg total) by mouth 2 (two) times daily with a meal. 30 tablet 0  . sitaGLIPtin (JANUVIA) 100 MG tablet Take 1 tablet (100 mg total) by mouth daily. 90 tablet 3  . traZODone (DESYREL) 50 MG tablet Take 50 mg by mouth at bedtime as needed for sleep.     No current facility-administered medications on file prior to visit.     BP 124/74   Pulse (!) 101   Temp 98 F (36.7 C) (Oral)   Ht 6' (1.829 m)   Wt (!) 320 lb 12 oz (145.5 kg)   SpO2 98%   BMI 43.50 kg/m     Objective:   Physical Exam  Constitutional: He is oriented to person, place, and time. He appears well-nourished.  Neck: Neck supple.  Cardiovascular: Normal rate and regular rhythm.  Pulmonary/Chest: Effort normal and breath sounds normal. He has no wheezes. He has no rales.  Neurological: He is alert and oriented to person, place, and time.  Skin: Skin is warm and dry.          Assessment & Plan:

## 2017-05-11 NOTE — Assessment & Plan Note (Signed)
Blood glucose levels post prandial too high, although he's not sure if he's waiting 2 hours after a meal before checking. Regardless, A1C pending.  Will likely have to discontinue Januvia, perhaps glipizide as well as he will be without insurance. Discussed to switch medications to [facility], discussed the $4 list.   Consider Relion 70/30 insulin OTC, discussed this with patient today. A1C pending.

## 2017-05-11 NOTE — Patient Instructions (Addendum)
Stop by the lab prior to leaving today. I will notify you of your results once received.   Consider switching to [facility] for your prescriptions.   Communicate with me via My Chart if you cannot afford any of your medications.   Start exercising. You should be getting 150 minutes of moderate intensity exercise weekly.  Continue to work on [employer] as discussed.  Please schedule a follow up appointment in 6 months.   It was a pleasure to see you today!    Knee Exercises Ask your health care provider which exercises are safe for you. Do exercises exactly as told by your health care provider and adjust them as directed. It is normal to feel mild stretching, pulling, tightness, or discomfort as you do these exercises, but you should stop right away if you feel sudden pain or your pain gets worse.Do not begin these exercises until told by your health care provider. STRETCHING AND RANGE OF MOTION EXERCISES These exercises warm up your muscles and joints and improve the movement and flexibility of your knee. These exercises also help to relieve pain, numbness, and tingling. Exercise A: Knee Extension, Prone 1. Lie on your abdomen on a bed. 2. Place your left / right knee just beyond the edge of the surface so your knee is not on the bed. You can put a towel under your left / right thigh just above your knee for comfort. 3. Relax your leg muscles and allow gravity to straighten your knee. You should feel a stretch behind your left / right knee. 4. Hold this position for __________ seconds. 5. Scoot up so your knee is supported between repetitions. Repeat __________ times. Complete this stretch __________ times a day. Exercise B: Knee Flexion, Active  1. Lie on your back with both knees straight. If this causes back discomfort, bend your left / right knee so your foot is flat on the floor. 2. Slowly slide your left / right heel back toward your buttocks until you feel a gentle stretch in the  front of your knee or thigh. 3. Hold this position for __________ seconds. 4. Slowly slide your left / right heel back to the starting position. Repeat __________ times. Complete this exercise __________ times a day. Exercise C: Quadriceps, Prone  1. Lie on your abdomen on a firm surface, such as a bed or padded floor. 2. Bend your left / right knee and hold your ankle. If you cannot reach your ankle or pant leg, loop a belt around your foot and grab the belt instead. 3. Gently pull your heel toward your buttocks. Your knee should not slide out to the side. You should feel a stretch in the front of your thigh and knee. 4. Hold this position for __________ seconds. Repeat __________ times. Complete this stretch __________ times a day. Exercise D: Hamstring, Supine 1. Lie on your back. 2. Loop a belt or towel over the ball of your left / right foot. The ball of your foot is on the walking surface, right under your toes. 3. Straighten your left / right knee and slowly pull on the belt to raise your leg until you feel a gentle stretch behind your knee. ? Do not let your left / right knee bend while you do this. ? Keep your other leg flat on the floor. 4. Hold this position for __________ seconds. Repeat __________ times. Complete this stretch __________ times a day. STRENGTHENING EXERCISES These exercises build strength and endurance in your knee.  Endurance is the ability to use your muscles for a long time, even after they get tired. Exercise E: Quadriceps, Isometric  1. Lie on your back with your left / right leg extended and your other knee bent. Put a rolled towel or small pillow under your knee if told by your health care provider. 2. Slowly tense the muscles in the front of your left / right thigh. You should see your kneecap slide up toward your hip or see increased dimpling just above the knee. This motion will push the back of the knee toward the floor. 3. For __________ seconds, keep  the muscle as tight as you can without increasing your pain. 4. Relax the muscles slowly and completely. Repeat __________ times. Complete this exercise __________ times a day. Exercise F: Straight Leg Raises - Quadriceps 1. Lie on your back with your left / right leg extended and your other knee bent. 2. Tense the muscles in the front of your left / right thigh. You should see your kneecap slide up or see increased dimpling just above the knee. Your thigh may even shake a bit. 3. Keep these muscles tight as you raise your leg 4-6 inches (10-15 cm) off the floor. Do not let your knee bend. 4. Hold this position for __________ seconds. 5. Keep these muscles tense as you lower your leg. 6. Relax your muscles slowly and completely after each repetition. Repeat __________ times. Complete this exercise __________ times a day. Exercise G: Hamstring, Isometric 1. Lie on your back on a firm surface. 2. Bend your left / right knee approximately __________ degrees. 3. Dig your left / right heel into the surface as if you are trying to pull it toward your buttocks. Tighten the muscles in the back of your thighs to dig as hard as you can without increasing any pain. 4. Hold this position for __________ seconds. 5. Release the tension gradually and allow your muscles to relax completely for __________ seconds after each repetition. Repeat __________ times. Complete this exercise __________ times a day. Exercise H: Hamstring Curls  If told by your health care provider, do this exercise while wearing ankle weights. Begin with __________ weights. Then increase the weight by 1 lb (0.5 kg) increments. Do not wear ankle weights that are more than __________. 1. Lie on your abdomen with your legs straight. 2. Bend your left / right knee as far as you can without feeling pain. Keep your hips flat against the floor. 3. Hold this position for __________ seconds. 4. Slowly lower your leg to the starting  position.  Repeat __________ times. Complete this exercise __________ times a day. Exercise I: Squats (Quadriceps) 1. Stand in front of a table, with your feet and knees pointing straight ahead. You may rest your hands on the table for balance but not for support. 2. Slowly bend your knees and lower your hips like you are going to sit in a chair. ? Keep your weight over your heels, not over your toes. ? Keep your lower legs upright so they are parallel with the table legs. ? Do not let your hips go lower than your knees. ? Do not bend lower than told by your health care provider. ? If your knee pain increases, do not bend as low. 3. Hold the squat position for __________ seconds. 4. Slowly push with your legs to return to standing. Do not use your hands to pull yourself to standing. Repeat __________ times. Complete this exercise __________ times  a day. Exercise J: Wall Slides (Quadriceps)  1. Lean your back against a smooth wall or door while you walk your feet out 18-24 inches (46-61 cm) from it. 2. Place your feet hip-width apart. 3. Slowly slide down the wall or door until your knees bend __________ degrees. Keep your knees over your heels, not over your toes. Keep your knees in line with your hips. 4. Hold for __________ seconds. Repeat __________ times. Complete this exercise __________ times a day. Exercise K: Straight Leg Raises - Hip Abductors 1. Lie on your side with your left / right leg in the top position. Lie so your head, shoulder, knee, and hip line up. You may bend your bottom knee to help you keep your balance. 2. Roll your hips slightly forward so your hips are stacked directly over each other and your left / right knee is facing forward. 3. Leading with your heel, lift your top leg 4-6 inches (10-15 cm). You should feel the muscles in your outer hip lifting. ? Do not let your foot drift forward. ? Do not let your knee roll toward the ceiling. 4. Hold this position for  __________ seconds. 5. Slowly return your leg to the starting position. 6. Let your muscles relax completely after each repetition. Repeat __________ times. Complete this exercise __________ times a day. Exercise L: Straight Leg Raises - Hip Extensors 1. Lie on your abdomen on a firm surface. You can put a pillow under your hips if that is more comfortable. 2. Tense the muscles in your buttocks and lift your left / right leg about 4-6 inches (10-15 cm). Keep your knee straight as you lift your leg. 3. Hold this position for __________ seconds. 4. Slowly lower your leg to the starting position. 5. Let your leg relax completely after each repetition. Repeat __________ times. Complete this exercise __________ times a day. This information is not intended to replace advice given to you by your health care provider. Make sure you discuss any questions you have with your health care provider. Document Released: 01/13/2005 Document Revised: 11/24/2015 Document Reviewed: 01/05/2015 Elsevier Interactive Patient Education  2018 [employer].   Diabetes Mellitus and Nutrition When you have diabetes (diabetes mellitus), it is very important to have healthy eating habits because your blood sugar (glucose) levels are greatly affected by what you eat and drink. Eating healthy foods in the appropriate amounts, at about the same times every day, can help you:  Control your blood glucose.  Lower your risk of heart disease.  Improve your blood pressure.  Reach or maintain a healthy weight.  Every person with diabetes is different, and each person has different needs for a meal plan. Your health care provider may recommend that you work with a diet and nutrition specialist (dietitian) to make a meal plan that is best for you. Your meal plan may vary depending on factors such as:  The calories you need.  The medicines you take.  Your weight.  Your blood glucose, blood pressure, and cholesterol  levels.  Your activity level.  Other health conditions you have, such as heart or kidney disease.  How do carbohydrates affect me? Carbohydrates affect your blood glucose level more than any other type of food. Eating carbohydrates naturally increases the amount of glucose in your blood. Carbohydrate counting is a method for keeping track of how many carbohydrates you eat. Counting carbohydrates is important to keep your blood glucose at a healthy level, especially if you use insulin or  take certain oral diabetes medicines. It is important to know how many carbohydrates you can safely have in each meal. This is different for every person. Your dietitian can help you calculate how many carbohydrates you should have at each meal and for snack. Foods that contain carbohydrates include:  Bread, cereal, rice, pasta, and crackers.  Potatoes and corn.  Peas, beans, and lentils.  Milk and yogurt.  Fruit and juice.  Desserts, such as cakes, cookies, ice cream, and candy.  How does alcohol affect me? Alcohol can cause a sudden decrease in blood glucose (hypoglycemia), especially if you use insulin or take certain oral diabetes medicines. Hypoglycemia can be a life-threatening condition. Symptoms of hypoglycemia (sleepiness, dizziness, and confusion) are similar to symptoms of having too much alcohol. If your health care provider says that alcohol is safe for you, follow these guidelines:  Limit alcohol intake to no more than 1 drink per day for nonpregnant women and 2 drinks per day for men. One drink equals 12 oz of beer, 5 oz of wine, or 1 oz of hard liquor.  Do not drink on an empty stomach.  Keep yourself hydrated with water, diet soda, or unsweetened iced tea.  Keep in mind that regular soda, juice, and other mixers may contain a lot of sugar and must be counted as carbohydrates.  What are tips for following this plan? Reading food labels  Start by checking the serving size on the  label. The amount of calories, carbohydrates, fats, and other nutrients listed on the label are based on one serving of the food. Many foods contain more than one serving per package.  Check the total grams (g) of carbohydrates in one serving. You can calculate the number of servings of carbohydrates in one serving by dividing the total carbohydrates by 15. For example, if a food has 30 g of total carbohydrates, it would be equal to 2 servings of carbohydrates.  Check the number of grams (g) of saturated and trans fats in one serving. Choose foods that have low or no amount of these fats.  Check the number of milligrams (mg) of sodium in one serving. Most people should limit total sodium intake to less than 2,300 mg per day.  Always check the nutrition information of foods labeled as "low-fat" or "nonfat". These foods may be higher in added sugar or refined carbohydrates and should be avoided.  Talk to your dietitian to identify your daily goals for nutrients listed on the label. Shopping  Avoid buying canned, premade, or processed foods. These foods tend to be high in fat, sodium, and added sugar.  Shop around the outside edge of the grocery store. This includes fresh fruits and vegetables, bulk grains, fresh meats, and fresh dairy. Cooking  Use low-heat cooking methods, such as baking, instead of high-heat cooking methods like deep frying.  Cook using healthy oils, such as olive, canola, or sunflower oil.  Avoid cooking with butter, cream, or high-fat meats. Meal planning  Eat meals and snacks regularly, preferably at the same times every day. Avoid going long periods of time without eating.  Eat foods high in fiber, such as fresh fruits, vegetables, beans, and whole grains. Talk to your dietitian about how many servings of carbohydrates you can eat at each meal.  Eat 4-6 ounces of lean protein each day, such as lean meat, chicken, fish, eggs, or tofu. 1 ounce is equal to 1 ounce of  meat, chicken, or fish, 1 egg, or 1/4 cup of  tofu.  Eat some foods each day that contain healthy fats, such as avocado, nuts, seeds, and fish. Lifestyle   Check your blood glucose regularly.  Exercise at least 30 minutes 5 or more days each week, or as told by your health care provider.  Take medicines as told by your health care provider.  Do not use any products that contain nicotine or tobacco, such as cigarettes and e-cigarettes. If you need help quitting, ask your health care provider.  Work with a Veterinary surgeon or diabetes educator to identify strategies to manage stress and any emotional and social challenges. What are some questions to ask my health care provider?  Do I need to meet with a diabetes educator?  Do I need to meet with a dietitian?  What number can I call if I have questions?  When are the best times to check my blood glucose? Where to find more information:  American Diabetes Association: diabetes.org/food-and-fitness/food  Academy of Nutrition and Dietetics: https://www.vargas.com/  [employer] of Diabetes and Digestive and Kidney Diseases (NIH): FindJewelers.cz Summary  A healthy meal plan will help you control your blood glucose and maintain a healthy lifestyle.  Working with a diet and nutrition specialist (dietitian) can help you make a meal plan that is best for you.  Keep in mind that carbohydrates and alcohol have immediate effects on your blood glucose levels. It is important to count carbohydrates and to use alcohol carefully. This information is not intended to replace advice given to you by your health care provider. Make sure you discuss any questions you have with your health care provider. Document Released: 11/26/2004 Document Revised: 04/05/2016 Document Reviewed: 04/05/2016 Elsevier Interactive Patient Education  [employer].

## 2017-05-15 ENCOUNTER — Encounter: Payer: Self-pay | Admitting: Primary Care

## 2017-05-30 ENCOUNTER — Encounter: Payer: Self-pay | Admitting: Primary Care

## 2017-06-02 ENCOUNTER — Encounter: Payer: Self-pay | Admitting: Primary Care

## 2017-06-06 ENCOUNTER — Encounter: Payer: Self-pay | Admitting: Primary Care

## 2017-06-22 ENCOUNTER — Encounter: Payer: Self-pay | Admitting: Primary Care

## 2017-07-19 ENCOUNTER — Encounter: Payer: Self-pay | Admitting: Primary Care

## 2017-07-19 DIAGNOSIS — E119 Type 2 diabetes mellitus without complications: Secondary | ICD-10-CM

## 2017-07-21 MED ORDER — GABAPENTIN 100 MG PO CAPS: ORAL_CAPSULE | ORAL | 0 refills | Status: DC

## 2017-07-26 ENCOUNTER — Telehealth: Payer: Self-pay

## 2017-07-26 NOTE — Telephone Encounter (Signed)
called patient in regards to a referral, unable to leave a message on both numbers on file-Lasasha Brophy V Bradi Arbuthnot, RMA

## 2017-07-26 NOTE — Telephone Encounter (Signed)
Called and spoke with patient and his wife. See referral note-Anastasiya V Hopkins, RMA

## 2017-07-26 NOTE — Telephone Encounter (Signed)
  PTs wife returned call,  She states she will be home most of day  call back at [phone]  Or cell  [phone]

## 2017-07-28 ENCOUNTER — Other Ambulatory Visit: Payer: Commercial Managed Care - PPO

## 2017-08-03 ENCOUNTER — Encounter: Payer: Commercial Managed Care - PPO | Admitting: Primary Care

## 2017-08-11 ENCOUNTER — Telehealth: Payer: Self-pay

## 2017-08-11 NOTE — Telephone Encounter (Signed)
Left message for patient to call Zayon Trulson back in regards to a referral and his insurance-Doraine Schexnider V Madelynn Malson, RMA

## 2017-08-22 ENCOUNTER — Other Ambulatory Visit: Payer: Self-pay | Admitting: Primary Care

## 2017-08-22 DIAGNOSIS — E119 Type 2 diabetes mellitus without complications: Secondary | ICD-10-CM

## 2017-08-22 NOTE — Telephone Encounter (Signed)
Ok to refill? Electronically refill request for gabapentin (NEURONTIN) 100 MG capsule  Last prescribed on 07/21/2017  Last seen on 05/11/2017

## 2017-08-23 DIAGNOSIS — E119 Type 2 diabetes mellitus without complications: Secondary | ICD-10-CM

## 2017-08-23 NOTE — Telephone Encounter (Signed)
Will send patient my chart message for an update on gabapentin.

## 2017-08-28 NOTE — Telephone Encounter (Signed)
Patient will notify when needing refills.

## 2017-08-30 MED ORDER — GABAPENTIN 100 MG PO CAPS: ORAL_CAPSULE | ORAL | 0 refills | Status: DC

## 2017-09-01 ENCOUNTER — Telehealth: Payer: Self-pay

## 2017-09-01 NOTE — Telephone Encounter (Signed)
[facility] pt referred to their office but needs last A1C and CMP; faxed 05/11/17 lab results to [phone]; pt is at office now. Faxed as requested.

## 2017-11-08 ENCOUNTER — Ambulatory Visit (INDEPENDENT_AMBULATORY_CARE_PROVIDER_SITE_OTHER)
Admission: RE | Admit: 2017-11-08 | Discharge: 2017-11-08 | Disposition: A | Discharge status: Home / Self Care | Payer: BLUE CROSS/BLUE SHIELD | Source: Ambulatory Visit | Attending: Primary Care | Admitting: Primary Care

## 2017-11-08 ENCOUNTER — Ambulatory Visit: Payer: BLUE CROSS/BLUE SHIELD | Admitting: Primary Care

## 2017-11-08 ENCOUNTER — Encounter: Payer: Self-pay | Admitting: Primary Care

## 2017-11-08 VITALS — BP 128/84 | HR 100 | Temp 98.0°F | Ht 72.0 in | Wt 296.8 lb

## 2017-11-08 DIAGNOSIS — M25562 Pain in left knee: Secondary | ICD-10-CM | POA: Diagnosis not present

## 2017-11-08 DIAGNOSIS — F329 Major depressive disorder, single episode, unspecified: Secondary | ICD-10-CM | POA: Diagnosis not present

## 2017-11-08 DIAGNOSIS — F419 Anxiety disorder, unspecified: Secondary | ICD-10-CM | POA: Diagnosis not present

## 2017-11-08 DIAGNOSIS — E119 Type 2 diabetes mellitus without complications: Secondary | ICD-10-CM

## 2017-11-08 DIAGNOSIS — F32A Depression, unspecified: Secondary | ICD-10-CM

## 2017-11-08 MED ORDER — NAPROXEN 500 MG PO TABS: ORAL_TABLET | ORAL | 0 refills | Status: DC

## 2017-11-08 NOTE — Assessment & Plan Note (Signed)
Following with endocrinology who has added Victoza, Trulicity, and Jardiance. Continue Novolin 70/30 and Metformin. Follow up scheduled for mid September, he will have his endocrinologist send over notes.

## 2017-11-08 NOTE — Progress Notes (Signed)
Subjective:    Patient ID: Scott Rice, male    DOB: 1965-04-05, 52 y.o.   MRN: [id number]  HPI  Scott Rice is a 52 year old male who presents today with a chief complaint of knee pain. Also for follow up. History of torn ACL to left knee years ago.   1) Knee Pain: His pain is located to the left knee under the patella that began 4 days ago. He was walking and slipped on a curb, leaned slightly onto his car which was parked by the curb preventing him from falling. He also noticed immediate swelling. He's used a brace, applied ice, and taken Ibuprofen with little improvement in pain. His pain is worse with standing. He has noticed a significant decrease in swelling.  2) Type 2 Diabetes:   Current medications include: Trulicity, Jardiance, Novolin 70/30, Metformin, Victoza. Gabapentin was added to his regimen several months ago, improved symptoms of neuropathy on this regimen.  He is checking his blood glucose 3 times daily and is getting readings of:  AM Fasting: low 110's  Before lunch: 120's Bedtime: 140's  Last A1C: 9.5 in February 2019, following with endocrinology now. Last Eye Exam: Due, he will schedule  Last Foot Exam: Due in November 2019 Pneumonia Vaccination: Completed in 2018 ACE/ARB: Lisinopril Statin: Lipitor   He is working on his diet by eating out less, has increased his intake of lean protein, increased legumes. He is not exercising regularly. He is following with endocrinology and has a follow up visit scheduled for mid September 2019.  Wt Readings from Last 3 Encounters:  11/08/17 296 lb 12.8 oz (134.6 kg)  05/11/17 (!) 320 lb 12 oz (145.5 kg)  04/11/17 (!) 307 lb 1.9 oz (139.3 kg)    3) Anxiety and Depression: Currently managed on Fluoxetine 40 mg and overall feels well managed. He has experienced increased stress recently since his mother in law moved in with he and his wife. Symptoms include not feeling like getting out of bed, feeling more down. He  also struggles with gambling and is requesting to see a therapist. He denies SI/HI.  Review of Systems  Respiratory: Negative for shortness of breath.   Cardiovascular: Negative for chest pain.  Musculoskeletal: Positive for arthralgias.  Skin: Negative for color change.  Neurological: Negative for numbness.  Psychiatric/Behavioral:       See HPI       No past medical history on file.   Social History   Socioeconomic History  . Marital status: Married    Spouse name: Not on file  . Number of children: Not on file  . Years of education: Not on file  . Highest education level: Not on file  Occupational History  . Not on file  Social Needs  . Financial resource strain: Not on file  . Food insecurity:    Worry: Not on file    Inability: Not on file  . Transportation needs:    Medical: Not on file    Non-medical: Not on file  Tobacco Use  . Smoking status: Former Games developer  . Smokeless tobacco: Never Used  . Tobacco comment: quit 40 years ago  Substance and Sexual Activity  . Alcohol use: No  . Drug use: No  . Sexual activity: Not on file  Lifestyle  . Physical activity:    Days per week: Not on file    Minutes per session: Not on file  . Stress: Not on file  Relationships  . Social  connections:    Talks on phone: Not on file    Gets together: Not on file    Attends religious service: Not on file    Active member of club or organization: Not on file    Attends meetings of clubs or organizations: Not on file    Relationship status: Not on file  . Intimate partner violence:    Fear of current or ex partner: Not on file    Emotionally abused: Not on file    Physically abused: Not on file    Forced sexual activity: Not on file  Other Topics Concern  . Not on file  Social History Narrative   Married.   No children.   Works as an Event organiserindependent contractor.   Enjoys watching movies, spending time with family.      No family history on file.  No Known  Allergies  Current Outpatient Medications on File Prior to Visit  Medication Sig Dispense Refill  . atorvastatin (LIPITOR) 20 MG tablet Take 1 tablet (20 mg total) by mouth every evening. 90 tablet 3  . B Complex-C (SUPER B COMPLEX PO) Take by mouth 2 (two) times daily.    . Calcium Carbonate-Vitamin D (CALCIUM 600/VITAMIN D PO) Take by mouth daily.    . Eluxadoline (VIBERZI) 100 MG TABS Take by mouth daily as needed.    Marland Kitchen. FLUoxetine (PROZAC) 40 MG capsule Take 1 capsule (40 mg total) by mouth daily. 90 capsule 3  . fluticasone (FLONASE) 50 MCG/ACT nasal spray Place into both nostrils daily as needed for allergies or rhinitis.    Marland Kitchen. gabapentin (NEURONTIN) 100 MG capsule Take 1 to 2 capsules at bedtime for neuropathic pain. 180 capsule 0  . insulin NPH-regular Human (NOVOLIN 70/30) (70-30) 100 UNIT/ML injection Inject 25 Units into the skin.    . Insulin Syringe-Needle U-100 (INSULIN SYRINGE 1CC/30GX5/16") 30G X 5/16" 1 ML MISC Inject twice daily with insulin. 100 each 5  . lisinopril (PRINIVIL,ZESTRIL) 10 MG tablet Take 1 tablet (10 mg total) by mouth daily. 90 tablet 3  . metFORMIN (GLUCOPHAGE) 1000 MG tablet Take 1 tablet (1,000 mg total) by mouth 2 (two) times daily with a meal. 180 tablet 3  . ranitidine (ZANTAC) 75 MG tablet Take 75 mg by mouth at bedtime.    . traZODone (DESYREL) 50 MG tablet Take 50 mg by mouth at bedtime as needed for sleep.    Marland Kitchen. JARDIANCE 25 MG TABS tablet Take 25 mg by mouth daily.  0  . TRULICITY 0.75 MG/0.5ML SOPN 0.75 mg once a week.  0  . VICTOZA 18 MG/3ML SOPN 1.8 mg daily.  0   No current facility-administered medications on file prior to visit.     BP 128/84   Pulse 100   Temp 98 F (36.7 C) (Oral)   Ht 6' (1.829 m)   Wt 296 lb 12.8 oz (134.6 kg)   SpO2 98%   BMI 40.25 kg/m    Objective:   Physical Exam  Constitutional: He appears well-nourished.  Cardiovascular: Normal rate and regular rhythm.  Respiratory: Effort normal and breath sounds  normal.  Musculoskeletal:       Left knee: He exhibits decreased range of motion and swelling. He exhibits no bony tenderness. No tenderness found.       Legs: Mild swelling to medial patella of left side.  Skin: Skin is warm and dry. No erythema.           Assessment & Plan:  Acute  Knee Pain:  Since twisting knee while slipping off of a curb. Exam today with mild swelling, likely bursitis. Continue knee brace, ice, elevation. Rx for Naproxen course refilled to use PRN. Discussed to avoid other NSAID's. Plain films pending. He will update if symptoms do not continue to improve. Consider PT.  Doreene Nest, NP

## 2017-11-08 NOTE — Patient Instructions (Addendum)
Complete xray(s) prior to leaving today. I will notify you of your results once received.  You may take the naproxen twice daily with food as needed for inflammation and pain to the knee. Continue to wear the knee brace as discussed. Ice and elevate your knee as needed.  Continue to follow up with the endocrinologist.   You will be contacted regarding your referral to therapy.  Please let us know if you have not been contacted within one week.   Please notify me if no improvement to your left knee in 1 week.  Please schedule a physical with me in 6 months. You may also schedule a lab only appointment 3-4 days prior. We will discuss your lab results in detail during your physical.  It was a pleasure to see you today!   Diabetes Mellitus and Nutrition When you have diabetes (diabetes mellitus), it is very important to have healthy eating habits because your blood sugar (glucose) levels are greatly affected by what you eat and drink. Eating healthy foods in the appropriate amounts, at about the same times every day, can help you:  Control your blood glucose.  Lower your risk of heart disease.  Improve your blood pressure.  Reach or maintain a healthy weight.  Every person with diabetes is different, and each person has different needs for a meal plan. Your health care provider may recommend that you work with a diet and nutrition specialist (dietitian) to make a meal plan that is best for you. Your meal plan may vary depending on factors such as:  The calories you need.  The medicines you take.  Your weight.  Your blood glucose, blood pressure, and cholesterol levels.  Your activity level.  Other health conditions you have, such as heart or kidney disease.  How do carbohydrates affect me? Carbohydrates affect your blood glucose level more than any other type of food. Eating carbohydrates naturally increases the amount of glucose in your blood. Carbohydrate counting is a method  for keeping track of how many carbohydrates you eat. Counting carbohydrates is important to keep your blood glucose at a healthy level, especially if you use insulin or take certain oral diabetes medicines. It is important to know how many carbohydrates you can safely have in each meal. This is different for every person. Your dietitian can help you calculate how many carbohydrates you should have at each meal and for snack. Foods that contain carbohydrates include:  Bread, cereal, rice, pasta, and crackers.  Potatoes and corn.  Peas, beans, and lentils.  Milk and yogurt.  Fruit and juice.  Desserts, such as cakes, cookies, ice cream, and candy.  How does alcohol affect me? Alcohol can cause a sudden decrease in blood glucose (hypoglycemia), especially if you use insulin or take certain oral diabetes medicines. Hypoglycemia can be a life-threatening condition. Symptoms of hypoglycemia (sleepiness, dizziness, and confusion) are similar to symptoms of having too much alcohol. If your health care provider says that alcohol is safe for you, follow these guidelines:  Limit alcohol intake to no more than 1 drink per day for nonpregnant women and 2 drinks per day for men. One drink equals 12 oz of beer, 5 oz of wine, or 1 oz of hard liquor.  Do not drink on an empty stomach.  Keep yourself hydrated with water, diet soda, or unsweetened iced tea.  Keep in mind that regular soda, juice, and other mixers may contain a lot of sugar and must be counted as carbohydrates.  What are tips for following this plan? Reading food labels  Start by checking the serving size on the label. The amount of calories, carbohydrates, fats, and other nutrients listed on the label are based on one serving of the food. Many foods contain more than one serving per package.  Check the total grams (g) of carbohydrates in one serving. You can calculate the number of servings of carbohydrates in one serving by dividing  the total carbohydrates by 15. For example, if a food has 30 g of total carbohydrates, it would be equal to 2 servings of carbohydrates.  Check the number of grams (g) of saturated and trans fats in one serving. Choose foods that have low or no amount of these fats.  Check the number of milligrams (mg) of sodium in one serving. Most people should limit total sodium intake to less than 2,300 mg per day.  Always check the nutrition information of foods labeled as "low-fat" or "nonfat". These foods may be higher in added sugar or refined carbohydrates and should be avoided.  Talk to your dietitian to identify your daily goals for nutrients listed on the label. Shopping  Avoid buying canned, premade, or processed foods. These foods tend to be high in fat, sodium, and added sugar.  Shop around the outside edge of the grocery store. This includes fresh fruits and vegetables, bulk grains, fresh meats, and fresh dairy. Cooking  Use low-heat cooking methods, such as baking, instead of high-heat cooking methods like deep frying.  Cook using healthy oils, such as olive, canola, or sunflower oil.  Avoid cooking with butter, cream, or high-fat meats. Meal planning  Eat meals and snacks regularly, preferably at the same times every day. Avoid going long periods of time without eating.  Eat foods high in fiber, such as fresh fruits, vegetables, beans, and whole grains. Talk to your dietitian about how many servings of carbohydrates you can eat at each meal.  Eat 4-6 ounces of lean protein each day, such as lean meat, chicken, fish, eggs, or tofu. 1 ounce is equal to 1 ounce of meat, chicken, or fish, 1 egg, or 1/4 cup of tofu.  Eat some foods each day that contain healthy fats, such as avocado, nuts, seeds, and fish. Lifestyle   Check your blood glucose regularly.  Exercise at least 30 minutes 5 or more days each week, or as told by your health care provider.  Take medicines as told by your  health care provider.  Do not use any products that contain nicotine or tobacco, such as cigarettes and e-cigarettes. If you need help quitting, ask your health care provider.  Work with a Veterinary surgeon or diabetes educator to identify strategies to manage stress and any emotional and social challenges. What are some questions to ask my health care provider?  Do I need to meet with a diabetes educator?  Do I need to meet with a dietitian?  What number can I call if I have questions?  When are the best times to check my blood glucose? Where to find more information:  American Diabetes Association: diabetes.org/food-and-fitness/food  Academy of Nutrition and Dietetics: https://www.vargas.com/  [employer] of Diabetes and Digestive and Kidney Diseases (NIH): FindJewelers.cz Summary  A healthy meal plan will help you control your blood glucose and maintain a healthy lifestyle.  Working with a diet and nutrition specialist (dietitian) can help you make a meal plan that is best for you.  Keep in mind that carbohydrates and alcohol have immediate  effects on your blood glucose levels. It is important to count carbohydrates and to use alcohol carefully. This information is not intended to replace advice given to you by your health care provider. Make sure you discuss any questions you have with your health care provider. Document Released: 11/26/2004 Document Revised: 04/05/2016 Document Reviewed: 04/05/2016 Elsevier Interactive Patient Education  [employer].

## 2017-11-08 NOTE — Assessment & Plan Note (Signed)
Overall doing well on Fluoxetine 40 mg, continue same. Referral placed to therapy given increased stress with some symptoms of depression, also for gambling addiction.

## 2017-11-16 ENCOUNTER — Ambulatory Visit: Payer: Self-pay | Admitting: Primary Care

## 2017-11-23 ENCOUNTER — Other Ambulatory Visit: Payer: Self-pay | Admitting: Primary Care

## 2017-11-23 DIAGNOSIS — E119 Type 2 diabetes mellitus without complications: Secondary | ICD-10-CM

## 2017-11-29 ENCOUNTER — Ambulatory Visit: Payer: BLUE CROSS/BLUE SHIELD | Admitting: Psychology

## 2017-11-29 DIAGNOSIS — F331 Major depressive disorder, recurrent, moderate: Secondary | ICD-10-CM

## 2017-12-13 ENCOUNTER — Ambulatory Visit: Payer: BLUE CROSS/BLUE SHIELD | Admitting: Psychology

## 2017-12-13 DIAGNOSIS — F331 Major depressive disorder, recurrent, moderate: Secondary | ICD-10-CM | POA: Diagnosis not present

## 2017-12-14 ENCOUNTER — Other Ambulatory Visit: Payer: Self-pay | Admitting: Primary Care

## 2017-12-15 ENCOUNTER — Encounter: Payer: Self-pay | Admitting: Primary Care

## 2017-12-27 ENCOUNTER — Encounter: Payer: Self-pay | Admitting: Primary Care

## 2018-01-17 ENCOUNTER — Ambulatory Visit: Payer: BLUE CROSS/BLUE SHIELD | Admitting: Psychology

## 2018-01-17 DIAGNOSIS — F331 Major depressive disorder, recurrent, moderate: Secondary | ICD-10-CM

## 2018-01-30 ENCOUNTER — Ambulatory Visit: Payer: BLUE CROSS/BLUE SHIELD | Admitting: Psychology

## 2018-01-30 DIAGNOSIS — F331 Major depressive disorder, recurrent, moderate: Secondary | ICD-10-CM

## 2018-02-05 ENCOUNTER — Other Ambulatory Visit: Payer: Self-pay | Admitting: Primary Care

## 2018-02-05 DIAGNOSIS — I10 Essential (primary) hypertension: Secondary | ICD-10-CM

## 2018-02-05 DIAGNOSIS — E785 Hyperlipidemia, unspecified: Secondary | ICD-10-CM

## 2018-02-05 DIAGNOSIS — F329 Major depressive disorder, single episode, unspecified: Secondary | ICD-10-CM

## 2018-02-05 DIAGNOSIS — F32A Depression, unspecified: Secondary | ICD-10-CM

## 2018-02-05 DIAGNOSIS — F419 Anxiety disorder, unspecified: Secondary | ICD-10-CM

## 2018-03-02 ENCOUNTER — Ambulatory Visit: Payer: BLUE CROSS/BLUE SHIELD | Admitting: Psychology

## 2018-03-02 DIAGNOSIS — F331 Major depressive disorder, recurrent, moderate: Secondary | ICD-10-CM | POA: Diagnosis not present

## 2018-03-13 ENCOUNTER — Other Ambulatory Visit: Payer: Self-pay

## 2018-03-13 ENCOUNTER — Other Ambulatory Visit: Payer: Self-pay | Admitting: Primary Care

## 2018-03-13 DIAGNOSIS — E119 Type 2 diabetes mellitus without complications: Secondary | ICD-10-CM

## 2018-03-14 NOTE — Telephone Encounter (Signed)
Electronic refill request. Gabapentin Last office visit:   11/08/17 Last Filled:    180 capsule 0 11/23/2017  Please advise.   

## 2018-03-14 NOTE — Telephone Encounter (Signed)
Electronic refill request. Gabapentin Last office visit:   11/08/17 Last Filled:    180 capsule 0 11/23/2017  Please advise.

## 2018-03-15 MED ORDER — GABAPENTIN 100 MG PO CAPS: ORAL_CAPSULE | ORAL | 2 refills | Status: DC

## 2018-03-15 NOTE — Telephone Encounter (Signed)
Noted, refill sent to pharmacy. 

## 2018-03-15 NOTE — Telephone Encounter (Signed)
Duplicate

## 2018-03-21 ENCOUNTER — Ambulatory Visit (INDEPENDENT_AMBULATORY_CARE_PROVIDER_SITE_OTHER): Payer: BLUE CROSS/BLUE SHIELD | Admitting: Psychology

## 2018-03-21 DIAGNOSIS — F331 Major depressive disorder, recurrent, moderate: Secondary | ICD-10-CM

## 2018-04-18 ENCOUNTER — Ambulatory Visit (INDEPENDENT_AMBULATORY_CARE_PROVIDER_SITE_OTHER): Payer: BLUE CROSS/BLUE SHIELD | Admitting: Psychology

## 2018-04-18 DIAGNOSIS — F331 Major depressive disorder, recurrent, moderate: Secondary | ICD-10-CM

## 2018-04-29 ENCOUNTER — Other Ambulatory Visit: Payer: Self-pay | Admitting: Primary Care

## 2018-04-29 DIAGNOSIS — E119 Type 2 diabetes mellitus without complications: Secondary | ICD-10-CM

## 2018-05-02 ENCOUNTER — Other Ambulatory Visit: Payer: Self-pay | Admitting: Primary Care

## 2018-05-09 ENCOUNTER — Other Ambulatory Visit: Payer: BLUE CROSS/BLUE SHIELD

## 2018-05-16 ENCOUNTER — Ambulatory Visit: Payer: BLUE CROSS/BLUE SHIELD | Admitting: Psychology

## 2018-05-16 ENCOUNTER — Ambulatory Visit (INDEPENDENT_AMBULATORY_CARE_PROVIDER_SITE_OTHER)
Admission: RE | Admit: 2018-05-16 | Discharge: 2018-05-16 | Disposition: A | Discharge status: Home / Self Care | Payer: BLUE CROSS/BLUE SHIELD | Source: Ambulatory Visit | Attending: Primary Care | Admitting: Primary Care

## 2018-05-16 ENCOUNTER — Encounter: Payer: Self-pay | Admitting: Primary Care

## 2018-05-16 ENCOUNTER — Ambulatory Visit (INDEPENDENT_AMBULATORY_CARE_PROVIDER_SITE_OTHER): Payer: BLUE CROSS/BLUE SHIELD | Admitting: Primary Care

## 2018-05-16 VITALS — BP 122/82 | HR 84 | Temp 98.2°F | Ht 72.0 in | Wt 284.5 lb

## 2018-05-16 DIAGNOSIS — E119 Type 2 diabetes mellitus without complications: Secondary | ICD-10-CM

## 2018-05-16 DIAGNOSIS — M25511 Pain in right shoulder: Secondary | ICD-10-CM

## 2018-05-16 DIAGNOSIS — F32A Depression, unspecified: Secondary | ICD-10-CM

## 2018-05-16 DIAGNOSIS — Z23 Encounter for immunization: Secondary | ICD-10-CM | POA: Diagnosis not present

## 2018-05-16 DIAGNOSIS — K219 Gastro-esophageal reflux disease without esophagitis: Secondary | ICD-10-CM

## 2018-05-16 DIAGNOSIS — Z0000 Encounter for general adult medical examination without abnormal findings: Secondary | ICD-10-CM | POA: Diagnosis not present

## 2018-05-16 DIAGNOSIS — F329 Major depressive disorder, single episode, unspecified: Secondary | ICD-10-CM

## 2018-05-16 DIAGNOSIS — Z0001 Encounter for general adult medical examination with abnormal findings: Secondary | ICD-10-CM | POA: Insufficient documentation

## 2018-05-16 DIAGNOSIS — Z125 Encounter for screening for malignant neoplasm of prostate: Secondary | ICD-10-CM

## 2018-05-16 DIAGNOSIS — E1141 Type 2 diabetes mellitus with diabetic mononeuropathy: Secondary | ICD-10-CM

## 2018-05-16 DIAGNOSIS — I10 Essential (primary) hypertension: Secondary | ICD-10-CM

## 2018-05-16 DIAGNOSIS — M7501 Adhesive capsulitis of right shoulder: Secondary | ICD-10-CM

## 2018-05-16 DIAGNOSIS — E1140 Type 2 diabetes mellitus with diabetic neuropathy, unspecified: Secondary | ICD-10-CM | POA: Insufficient documentation

## 2018-05-16 DIAGNOSIS — E785 Hyperlipidemia, unspecified: Secondary | ICD-10-CM

## 2018-05-16 DIAGNOSIS — F419 Anxiety disorder, unspecified: Secondary | ICD-10-CM

## 2018-05-16 LAB — PSA: PSA: 0.31 ng/mL (ref 0.10–4.00)

## 2018-05-16 NOTE — Assessment & Plan Note (Signed)
Recent LDL at goal per labs in January 2020. Continue atorvastatin.

## 2018-05-16 NOTE — Assessment & Plan Note (Signed)
Overall doing well on gabapentin. Has had recurrent falls when wearing socks in the house, tripping over dog toys and food. Discussed to wear house shoes. Continue to monitor.

## 2018-05-16 NOTE — Assessment & Plan Note (Addendum)
Tetanus due, provided today. Pneumonia vaccination UTD. PSA due today. Colonoscopy due in 2021 per patient. Discussed the importance of a healthy diet and regular exercise in order for weight loss, and to reduce the risk of any potential medical problems. Exam with decrease in ROM to right shoulder, this is chronic but is worse since fall last night. Checking xray of right shoulder. Labs reviewed from endocrinologist, see labs scanned into Epic. Follow up in 1 year for CPE.

## 2018-05-16 NOTE — Assessment & Plan Note (Addendum)
A1C of 5.9 on labs from January 2020. Following with endocrinology, continue current regimen. Continue gabapentin.

## 2018-05-16 NOTE — Assessment & Plan Note (Signed)
Stable in the office today, continue current regimen. 

## 2018-05-16 NOTE — Progress Notes (Signed)
Subjective:    Patient ID: Scott Rice, male    DOB: [birth year], 53 y.o.   MRN: [id number]  HPI  Mr. Puskarich is a 53 year old male who presents today for complete physical. He also endorses a chief complaint of right shoulder pain.  Larey Seat last night, tripped over a dog toy and landed into a door jam on his right shoulder. Chronic pain to right shoulder for years, was working with orthopedics. Since last night he's had decrease in ROM with increase in pain. He denies numbness/tingling to right upper extremity.  Immunizations: -Tetanus: Completed over 10 years ago -Influenza: Completed this season  -Pneumonia: Completed in 2018  Diet: He endorses a healthy diet Breakfast: Skips mostly, fruit, granola bar Lunch: Salads with protein at home and at restaurants Dinner: Chicken, pork, rice, potatoes, little vegetables  Snacks: Vegetables with cheese, peanut butter, nuts Desserts: Lower calorie desserts daily  Beverages: Water, diet soda, un-sweet tea  Exercise: He is not exercising, does walk his dogs Eye exam: Completed in 2019 Dental exam: Completes annually  Colonoscopy: Completed in 2011 PSA: Due this Summer  BP Readings from Last 3 Encounters:  05/16/18 122/82  11/08/17 128/84  05/11/17 124/74     Review of Systems  Constitutional: Negative for unexpected weight change.  HENT: Negative for rhinorrhea.   Respiratory: Negative for cough and shortness of breath.   Cardiovascular: Negative for chest pain.  Gastrointestinal: Negative for constipation.       GERD  Genitourinary: Negative for difficulty urinating.  Musculoskeletal: Positive for arthralgias.       Acute on chronic right shoulder pain  Skin: Negative for rash.  Allergic/Immunologic: Negative for environmental allergies.  Neurological: Positive for numbness. Negative for dizziness and headaches.  Psychiatric/Behavioral:       Doing well on fluoxetine        Past Medical History:  Diagnosis Date  .  Anxiety and depression   . Diabetic neuropathy (HCC)   . Essential hypertension   . Hyperlipidemia   . Type 2 diabetes mellitus (HCC)      Social History   Socioeconomic History  . Marital status: Married    Spouse name: Not on file  . Number of children: Not on file  . Years of education: Not on file  . Highest education level: Not on file  Occupational History  . Not on file  Social Needs  . Financial resource strain: Not on file  . Food insecurity:    Worry: Not on file    Inability: Not on file  . Transportation needs:    Medical: Not on file    Non-medical: Not on file  Tobacco Use  . Smoking status: Former Games developer  . Smokeless tobacco: Never Used  . Tobacco comment: quit 40 years ago  Substance and Sexual Activity  . Alcohol use: No  . Drug use: No  . Sexual activity: Not on file  Lifestyle  . Physical activity:    Days per week: Not on file    Minutes per session: Not on file  . Stress: Not on file  Relationships  . Social connections:    Talks on phone: Not on file    Gets together: Not on file    Attends religious service: Not on file    Active member of club or organization: Not on file    Attends meetings of clubs or organizations: Not on file    Relationship status: Not on file  . Intimate partner violence:  Fear of current or ex partner: Not on file    Emotionally abused: Not on file    Physically abused: Not on file    Forced sexual activity: Not on file  Other Topics Concern  . Not on file  Social History Narrative   Married.   No children.   Works as an Event organiser.   Enjoys watching movies, spending time with family.     No past surgical history on file.  No family history on file.  No Known Allergies  Current Outpatient Medications on File Prior to Visit  Medication Sig Dispense Refill  . atorvastatin (LIPITOR) 20 MG tablet TAKE 1 TABLET BY MOUTH EVERY DAY IN THE EVENING 90 tablet 1  . B Complex-C (SUPER B COMPLEX PO)  Take by mouth 2 (two) times daily.    . Calcium Carbonate-Vitamin D (CALCIUM 600/VITAMIN D PO) Take by mouth daily.    . Eluxadoline (VIBERZI) 100 MG TABS Take by mouth daily as needed.    Marland Kitchen FLUoxetine (PROZAC) 40 MG capsule TAKE 1 CAPSULE BY MOUTH EVERY DAY 90 capsule 1  . fluticasone (FLONASE) 50 MCG/ACT nasal spray Place into both nostrils daily as needed for allergies or rhinitis.    Marland Kitchen gabapentin (NEURONTIN) 100 MG capsule Take 1-2 capsules by mouth at bedtime for neuropathic pain. 180 capsule 2  . insulin NPH-regular Human (NOVOLIN 70/30) (70-30) 100 UNIT/ML injection Inject 25 Units into the skin 2 (two) times daily.    . Insulin Syringe-Needle U-100 (INSULIN SYRINGE 1CC/30GX5/16") 30G X 5/16" 1 ML MISC Inject twice daily with insulin. 100 each 5  . JARDIANCE 25 MG TABS tablet Take 25 mg by mouth daily.  0  . lisinopril (PRINIVIL,ZESTRIL) 10 MG tablet TAKE 1 TABLET BY MOUTH EVERY DAY 90 tablet 1  . metFORMIN (GLUCOPHAGE) 1000 MG tablet TAKE 1 TABLET BY MOUTH 2 TIMES DAILY WITH A MEAL. 180 tablet 0  . naproxen (NAPROSYN) 500 MG tablet Take 1 tablet by mouth twice daily with food as needed for pain and inflammation. 30 tablet 0  . traZODone (DESYREL) 50 MG tablet Take 50 mg by mouth at bedtime as needed for sleep.    . TRULICITY 0.75 MG/0.5ML SOPN 0.75 mg once a week.  0  . VICTOZA 18 MG/3ML SOPN 1.8 mg daily.  0   No current facility-administered medications on file prior to visit.     BP 122/82   Pulse 84   Temp 98.2 F (36.8 C) (Oral)   Ht 6' (1.829 m)   Wt 284 lb 8 oz (129 kg)   SpO2 98%   BMI 38.59 kg/m    Objective:   Physical Exam  Constitutional: He is oriented to person, place, and time. He appears well-nourished.  HENT:  Mouth/Throat: No oropharyngeal exudate.  Eyes: Pupils are equal, round, and reactive to light. EOM are normal.  Neck: Neck supple. No thyromegaly present.  Cardiovascular: Normal rate and regular rhythm.  Respiratory: Effort normal and breath  sounds normal.  GI: Soft. Bowel sounds are normal. There is no abdominal tenderness.  Musculoskeletal:     Right shoulder: He exhibits decreased range of motion and pain. He exhibits no tenderness and no deformity.     Comments: Decrease in ROM with abduction in most planes. 3/5 strength to right shoulder  Neurological: He is alert and oriented to person, place, and time.  Skin: Skin is warm and dry.  Psychiatric: He has a normal mood and affect.  Assessment & Plan:

## 2018-05-16 NOTE — Addendum Note (Signed)
Addended by: Tawnya Crook on: 05/16/2018 02:30 PM   Modules accepted: Orders

## 2018-05-16 NOTE — Assessment & Plan Note (Signed)
Previously following with [facility]. Doing home exercises, very slight improvement overall. Fell onto right shoulder last night, increased pain. Check xray of right shoulder today. Discussed to follow up with orthopedics for re-evaluation.

## 2018-05-16 NOTE — Assessment & Plan Note (Addendum)
Using Trazodone 50 mg as needed for insomnia, continue same. Doing well on fluoxetine 40 mg, continue same. He does admit to taking an extra 10 mg dose 2-3 times monthly, these are tablets left over from an older prescription.

## 2018-05-16 NOTE — Patient Instructions (Addendum)
Start Nexium 20 mg for heartburn. This can be purchased over the counter. Please update me in a few weeks.  Schedule an appointment with Dr. Lorelei Pont for your shoulder as discussed.  Complete xray(s) prior to leaving today. I will notify you of your results once received.  Please notify me of where you had your last colonoscopy.   PSA with next set of labs if possible.   Start exercising. You should be getting 150 minutes of moderate intensity exercise weekly.  It's important to improve your diet by reducing consumption of fast food, fried food, processed snack foods, sugary drinks. Increase consumption of fresh vegetables and fruits, whole grains, water.  Ensure you are drinking 64 ounces of water daily.  It was a pleasure to see you today!   Preventive Care 40-64 Years, Male Preventive care refers to lifestyle choices and visits with your health care provider that can promote health and wellness. What does preventive care include?   A yearly physical exam. This is also called an annual well check.  Dental exams once or twice a year.  Routine eye exams. Ask your health care provider how often you should have your eyes checked.  Personal lifestyle choices, including: ? Daily care of your teeth and gums. ? Regular physical activity. ? Eating a healthy diet. ? Avoiding tobacco and drug use. ? Limiting alcohol use. ? Practicing safe sex. ? Taking low-dose aspirin every day starting at age 23. What happens during an annual well check? The services and screenings done by your health care provider during your annual well check will depend on your age, overall health, lifestyle risk factors, and family history of disease. Counseling Your health care provider may ask you questions about your:  Alcohol use.  Tobacco use.  Drug use.  Emotional well-being.  Home and relationship well-being.  Sexual activity.  Eating habits.  Work and work Statistician. Screening You may  have the following tests or measurements:  Height, weight, and BMI.  Blood pressure.  Lipid and cholesterol levels. These may be checked every 5 years, or more frequently if you are over 69 years old.  Skin check.  Lung cancer screening. You may have this screening every year starting at age 60 if you have a 30-pack-year history of smoking and currently smoke or have quit within the past 15 years.  Colorectal cancer screening. All adults should have this screening starting at age 53 and continuing until age 7. Your health care provider may recommend screening at age 82. You will have tests every 1-10 years, depending on your results and the type of screening test. People at increased risk should start screening at an earlier age. Screening tests may include: ? Guaiac-based fecal occult blood testing. ? Fecal immunochemical test (FIT). ? Stool DNA test. ? Virtual colonoscopy. ? Sigmoidoscopy. During this test, a flexible tube with a tiny camera (sigmoidoscope) is used to examine your rectum and lower colon. The sigmoidoscope is inserted through your anus into your rectum and lower colon. ? Colonoscopy. During this test, a long, thin, flexible tube with a tiny camera (colonoscope) is used to examine your entire colon and rectum.  Prostate cancer screening. Recommendations will vary depending on your family history and other risks.  Hepatitis C blood test.  Hepatitis B blood test.  Sexually transmitted disease (STD) testing.  Diabetes screening. This is done by checking your blood sugar (glucose) after you have not eaten for a while (fasting). You may have this done every 1-3 years.  Discuss your test results, treatment options, and if necessary, the need for more tests with your health care provider. Vaccines Your health care provider may recommend certain vaccines, such as:  Influenza vaccine. This is recommended every year.  Tetanus, diphtheria, and acellular pertussis (Tdap, Td)  vaccine. You may need a Td booster every 10 years.  Varicella vaccine. You may need this if you have not been vaccinated.  Zoster vaccine. You may need this after age 70.  Measles, mumps, and rubella (MMR) vaccine. You may need at least one dose of MMR if you were born in 1957 or later. You may also need a second dose.  Pneumococcal 13-valent conjugate (PCV13) vaccine. You may need this if you have certain conditions and have not been vaccinated.  Pneumococcal polysaccharide (PPSV23) vaccine. You may need one or two doses if you smoke cigarettes or if you have certain conditions.  Meningococcal vaccine. You may need this if you have certain conditions.  Hepatitis A vaccine. You may need this if you have certain conditions or if you travel or work in places where you may be exposed to hepatitis A.  Hepatitis B vaccine. You may need this if you have certain conditions or if you travel or work in places where you may be exposed to hepatitis B.  Haemophilus influenzae type b (Hib) vaccine. You may need this if you have certain risk factors. Talk to your health care provider about which screenings and vaccines you need and how often you need them. This information is not intended to replace advice given to you by your health care provider. Make sure you discuss any questions you have with your health care provider. Document Released: 03/28/2015 Document Revised: 04/21/2017 Document Reviewed: 12/31/2014 Elsevier Interactive Patient Education  2019 [employer].

## 2018-05-16 NOTE — Assessment & Plan Note (Signed)
Continued symptoms despite Pepcid and Zantac. Has tried omeprazole in the past, doesn't believe it was helpful.  Will have him trial Nexium 20 mg, he will update.

## 2018-05-22 ENCOUNTER — Ambulatory Visit (INDEPENDENT_AMBULATORY_CARE_PROVIDER_SITE_OTHER): Payer: BLUE CROSS/BLUE SHIELD | Admitting: Psychology

## 2018-05-22 DIAGNOSIS — F331 Major depressive disorder, recurrent, moderate: Secondary | ICD-10-CM | POA: Diagnosis not present

## 2018-06-20 DIAGNOSIS — K219 Gastro-esophageal reflux disease without esophagitis: Secondary | ICD-10-CM

## 2018-06-22 MED ORDER — ESOMEPRAZOLE MAGNESIUM 40 MG PO CPDR: 40.0000 mg | DELAYED_RELEASE_CAPSULE | Freq: Every day | ORAL | 0 refills | Status: DC

## 2018-06-27 ENCOUNTER — Ambulatory Visit: Payer: BLUE CROSS/BLUE SHIELD | Admitting: Psychology

## 2018-07-25 ENCOUNTER — Other Ambulatory Visit: Payer: Self-pay | Admitting: Primary Care

## 2018-07-25 DIAGNOSIS — E119 Type 2 diabetes mellitus without complications: Secondary | ICD-10-CM

## 2018-08-11 ENCOUNTER — Other Ambulatory Visit: Payer: Self-pay | Admitting: Primary Care

## 2018-08-11 DIAGNOSIS — F329 Major depressive disorder, single episode, unspecified: Secondary | ICD-10-CM

## 2018-08-11 DIAGNOSIS — F419 Anxiety disorder, unspecified: Secondary | ICD-10-CM

## 2018-08-11 DIAGNOSIS — E785 Hyperlipidemia, unspecified: Secondary | ICD-10-CM

## 2018-08-11 DIAGNOSIS — I10 Essential (primary) hypertension: Secondary | ICD-10-CM

## 2018-08-11 DIAGNOSIS — F32A Depression, unspecified: Secondary | ICD-10-CM

## 2018-09-16 ENCOUNTER — Other Ambulatory Visit: Payer: Self-pay | Admitting: Primary Care

## 2018-09-16 DIAGNOSIS — K219 Gastro-esophageal reflux disease without esophagitis: Secondary | ICD-10-CM

## 2018-09-26 ENCOUNTER — Telehealth: Payer: Self-pay | Admitting: Primary Care

## 2018-09-26 NOTE — Telephone Encounter (Signed)
Pt dropped off lab work from [facility]   On cart to be delivered

## 2018-09-27 NOTE — Telephone Encounter (Signed)
Noted and reviewed. 

## 2019-02-12 NOTE — Telephone Encounter (Signed)
Copied from Christine [id number]. Topic: Quick Communication - See Telephone Encounter >> Feb 12, 2019 10:44 AM Loma Boston wrote: CRM for notification. See Telephone encounter for: 02/12/19. This pt has already sent info over for a message MyChart as to a possible Abscess and needs an appt, called in on [place] line, wants to make sure of FU [phone]     PEC

## 2019-02-13 ENCOUNTER — Encounter: Payer: Self-pay | Admitting: Primary Care

## 2019-02-13 ENCOUNTER — Ambulatory Visit (INDEPENDENT_AMBULATORY_CARE_PROVIDER_SITE_OTHER): Payer: BC Managed Care – PPO | Admitting: Primary Care

## 2019-02-13 ENCOUNTER — Other Ambulatory Visit: Payer: Self-pay

## 2019-02-13 VITALS — BP 132/82 | HR 86 | Temp 96.3°F | Ht 72.0 in | Wt 290.0 lb

## 2019-02-13 DIAGNOSIS — T148XXA Other injury of unspecified body region, initial encounter: Secondary | ICD-10-CM

## 2019-02-13 NOTE — Progress Notes (Signed)
Subjective:    Patient ID: Scott Rice, male    DOB: 1965-12-12, 53 y.o.   MRN: [id number]  HPI  Scott Rice is a 53 year old male with a history of hypertension, diabetes, hyperlipidemia who presents today with a chief complaint of neck mass.  His mass is located to the lower posterior neck which he first noticed two weeks after a few pans stacked on the top shelf of his cabinet fell onto his posterior neck. He noticed bruising and swelling the following day. Since then the swelling has reduced, he denies pain and decrease in ROM. He does have itching to the area.   He's applied ice to the site, also used a topical antibiotic ointment.   Review of Systems  Musculoskeletal: Negative for neck pain.  Skin: Positive for color change.       Skin mass  Neurological: Negative for weakness and numbness.       Past Medical History:  Diagnosis Date  . Anxiety and depression   . Diabetic neuropathy ([place])   . Essential hypertension   . Hyperlipidemia   . Type 2 diabetes mellitus ([place])      Social History   Socioeconomic History  . Marital status: Married    Spouse name: Not on file  . Number of children: Not on file  . Years of education: Not on file  . Highest education level: Not on file  Occupational History  . Not on file  Social Needs  . Financial resource strain: Not on file  . Food insecurity    Worry: Not on file    Inability: Not on file  . Transportation needs    Medical: Not on file    Non-medical: Not on file  Tobacco Use  . Smoking status: Former Research scientist (life sciences)  . Smokeless tobacco: Never Used  . Tobacco comment: quit 40 years ago  Substance and Sexual Activity  . Alcohol use: No  . Drug use: No  . Sexual activity: Not on file  Lifestyle  . Physical activity    Days per week: Not on file    Minutes per session: Not on file  . Stress: Not on file  Relationships  . Social Herbalist on phone: Not on file    Gets together: Not on file   Attends religious service: Not on file    Active member of club or organization: Not on file    Attends meetings of clubs or organizations: Not on file    Relationship status: Not on file  . Intimate partner violence    Fear of current or ex partner: Not on file    Emotionally abused: Not on file    Physically abused: Not on file    Forced sexual activity: Not on file  Other Topics Concern  . Not on file  Social History Narrative   Married.   No children.   Works as an Chief Executive Officer.   Enjoys watching movies, spending time with family.     No past surgical history on file.  No family history on file.  No Known Allergies  Current Outpatient Medications on File Prior to Visit  Medication Sig Dispense Refill  . atorvastatin (LIPITOR) 20 MG tablet TAKE 1 TABLET BY MOUTH EVERY DAY IN THE EVENING 90 tablet 1  . B Complex-C (SUPER B COMPLEX PO) Take by mouth 2 (two) times daily.    . Calcium Carbonate-Vitamin D (CALCIUM 600/VITAMIN D PO) Take by mouth daily.    Marland Kitchen  FLUoxetine (PROZAC) 40 MG capsule TAKE 1 CAPSULE BY MOUTH EVERY DAY 90 capsule 1  . gabapentin (NEURONTIN) 100 MG capsule Take 1-2 capsules by mouth at bedtime for neuropathic pain. 180 capsule 2  . insulin NPH-regular Human (NOVOLIN 70/30) (70-30) 100 UNIT/ML injection Inject 25 Units into the skin 2 (two) times daily.    . Insulin Syringe-Needle U-100 (INSULIN SYRINGE 1CC/30GX5/16") 30G X 5/16" 1 ML MISC Inject twice daily with insulin. 100 each 5  . JARDIANCE 25 MG TABS tablet Take 25 mg by mouth daily.  0  . lisinopril (ZESTRIL) 10 MG tablet TAKE 1 TABLET BY MOUTH EVERY DAY 90 tablet 1  . metFORMIN (GLUCOPHAGE) 1000 MG tablet TAKE 1 TABLET BY MOUTH TWICE A DAY WITH MEALS 180 tablet 0  . naproxen (NAPROSYN) 500 MG tablet Take 1 tablet by mouth twice daily with food as needed for pain and inflammation. 30 tablet 0  . traZODone (DESYREL) 50 MG tablet Take 50 mg by mouth at bedtime as needed for sleep.     No current  facility-administered medications on file prior to visit.     BP 132/82   Pulse 86   Temp (!) 96.3 F (35.7 C) (Temporal)   Ht 6' (1.829 m)   Wt 290 lb (131.5 kg)   SpO2 98%   BMI 39.33 kg/m    Objective:   Physical Exam  Constitutional: He appears well-nourished.  Neck: Normal range of motion. Neck supple.  Skin: Skin is warm and dry.  Light purple bruising to lower posterior neck with hematoma measuring 4 cm in length, 1.5 cm in width. Non tender. No surrounding erythema.            Assessment & Plan:  Hematoma:  Evident to posterior neck. Low suspicion for fracture given normal ROM without pain or numbness. Discussed home care instructions, also discussed that hematoma will dissolve slowly over time.  Discussed strict return precautions including pain, decrease in ROM, increased swelling, etc.  Follow up as needed. Doreene Nest, NP

## 2019-02-13 NOTE — Patient Instructions (Signed)
You can continue to apply ice to the site for any swelling.  Ibuprofen may help to reduce swelling.  The site will reduce slowly over time as discussed.  Please message me if you develop pain, decrease in range of motion, increased swelling.  It was a pleasure to see you today!   Hematoma A hematoma is a collection of blood. A hematoma can happen:  Under the skin.  In an organ.  In a body space.  In a joint space.  In other tissues. The blood can thicken (clot) to form a lump that you can see and feel. The lump is often hard and may become sore and tender. The lump can be very small or very big. Most hematomas get better in a few days to weeks. However, some hematomas may be serious and need medical care. What are the causes? This condition is caused by:  An injury.  Blood that leaks under the skin.  Problems from surgeries.  Medical conditions that cause bleeding or bruising. What increases the risk? You are more likely to develop this condition if:  You are an older adult.  You use medicines that thin your blood. What are the signs or symptoms? Symptoms depend on where the hematoma is in your body.  If the hematoma is under the skin, there is: ? A firm lump on the body. ? Pain and tenderness in the area. ? Bruising. The skin above the lump may be blue, dark blue, purple-red, or yellowish.  If the hematoma is deep in the tissues or body spaces, there may be: ? Blood in the stomach. This may cause pain in the belly (abdomen), weakness, passing out (fainting), and shortness of breath. ? Blood in the head. This may cause a headache, weakness, trouble speaking or understanding speech, or passing out. How is this diagnosed? This condition is diagnosed based on:  Your medical history.  A physical exam.  Imaging tests, such as ultrasound or CT scan.  Blood tests. How is this treated? Treatment depends on the cause, size, and location of the hematoma. Treatment  may include:  Doing nothing. Many hematomas go away on their own without treatment.  Surgery or close monitoring. This may be needed for large hematomas or hematomas that affect the body's organs.  Medicines. These may be given if a medical condition caused the hematoma. Follow these instructions at home: Managing pain, stiffness, and swelling   If told, put ice on the area. ? Put ice in a plastic bag. ? Place a towel between your skin and the bag. ? Leave the ice on for 20 minutes, 2-3 times a day for the first two days.  If told, put heat on the affected area after putting ice on the area for two days. Use the heat source that your doctor tells you to use. This could be a moist heat pack or a heating pad. To do this: ? Place a towel between your skin and the heat source. ? Leave the heat on for 20-30 minutes. ? Remove the heat if your skin turns bright red. This is very important if you are unable to feel pain, heat, or cold. You may have a greater risk of getting burned.  Raise (elevate) the affected area above the level of your heart while you are sitting or lying down.  Wrap the affected area with an elastic bandage, if told by your doctor. Do not wrap the bandage too tightly.  If your hematoma is on a  leg or foot and is painful, your doctor may give you crutches. Use them as told by your doctor. General instructions  Take over-the-counter and prescription medicines only as told by your doctor.  Keep all follow-up visits as told by your doctor. This is important. Contact a doctor if:  You have a fever.  The swelling or bruising gets worse.  You start to get more hematomas. Get help right away if:  Your pain gets worse.  Your pain is not getting better with medicine.  Your skin over the hematoma breaks or starts to bleed.  Your hematoma is in your chest or belly and you: ? Pass out. ? Feel weak. ? Become short of breath.  You have a hematoma on your scalp that  is caused by a fall or injury, and you: ? Have a headache that gets worse. ? Have trouble speaking or understanding speech. ? Become less alert or you pass out. Summary  A hematoma is a collection of blood in any part of your body.  Most hematomas get better on their own in a few days to weeks. Some may need medical care.  Follow instructions from your doctor about how to care for your hematoma.  Contact a doctor if the swelling or bruising gets worse, or if you are short of breath. This information is not intended to replace advice given to you by your health care provider. Make sure you discuss any questions you have with your health care provider. Document Released: 04/08/2004 Document Revised: 08/04/2017 Document Reviewed: 08/04/2017 Elsevier Patient Education  2020 [employer].

## 2019-03-02 ENCOUNTER — Other Ambulatory Visit: Payer: Self-pay | Admitting: Primary Care

## 2019-03-02 DIAGNOSIS — F329 Major depressive disorder, single episode, unspecified: Secondary | ICD-10-CM

## 2019-03-02 DIAGNOSIS — F32A Depression, unspecified: Secondary | ICD-10-CM

## 2019-03-02 DIAGNOSIS — E785 Hyperlipidemia, unspecified: Secondary | ICD-10-CM

## 2019-03-02 DIAGNOSIS — I10 Essential (primary) hypertension: Secondary | ICD-10-CM

## 2019-09-11 ENCOUNTER — Other Ambulatory Visit: Payer: Self-pay | Admitting: Primary Care

## 2019-09-11 DIAGNOSIS — E785 Hyperlipidemia, unspecified: Secondary | ICD-10-CM

## 2019-09-11 DIAGNOSIS — F419 Anxiety disorder, unspecified: Secondary | ICD-10-CM

## 2019-09-11 DIAGNOSIS — F32A Depression, unspecified: Secondary | ICD-10-CM

## 2019-09-11 DIAGNOSIS — I10 Essential (primary) hypertension: Secondary | ICD-10-CM

## 2019-10-07 IMAGING — DX DG KNEE COMPLETE 4+V*L*
4 series · 4 of 4 positions shown · non-contrast
Comparison: None.

CLINICAL DATA: Acute left knee pain after slipping off curb of
sidewalk

EXAM:
LEFT KNEE - COMPLETE 4+ VIEW

[knee ap]
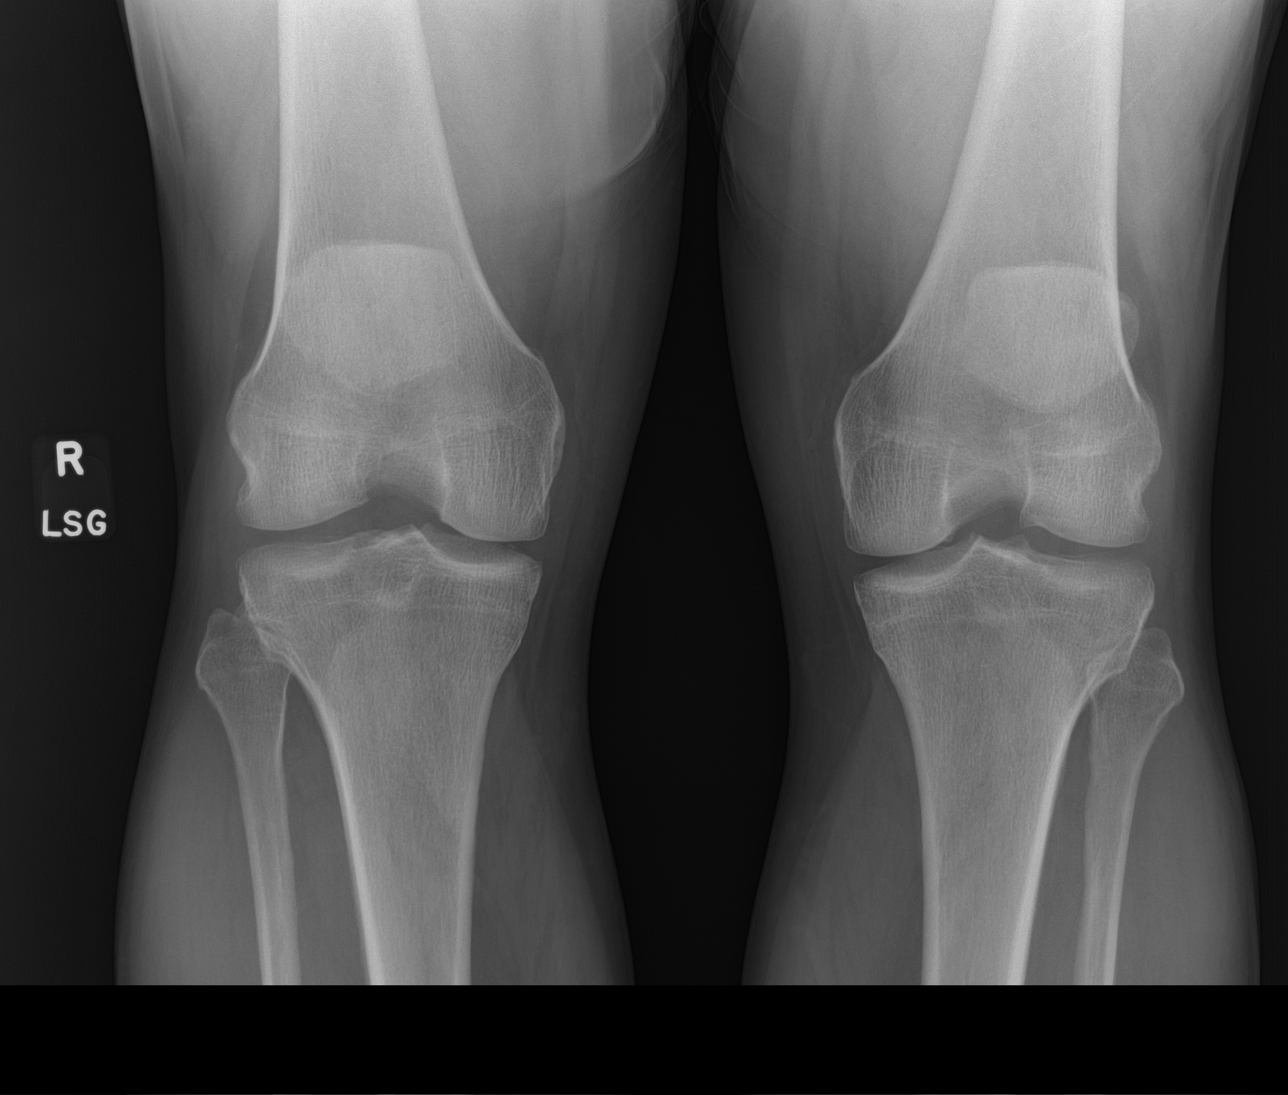

[knee tunnel]
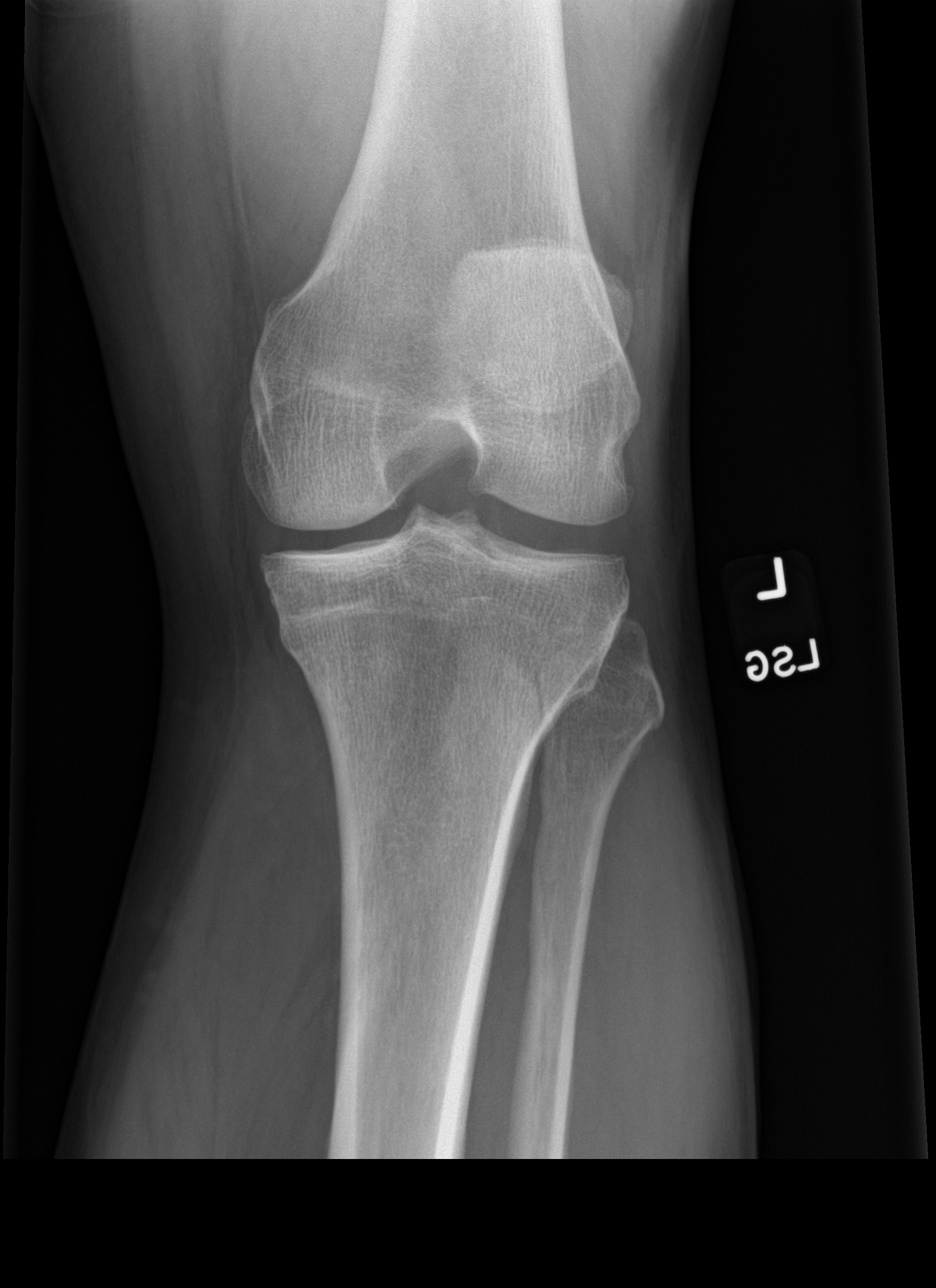

[knee lat]
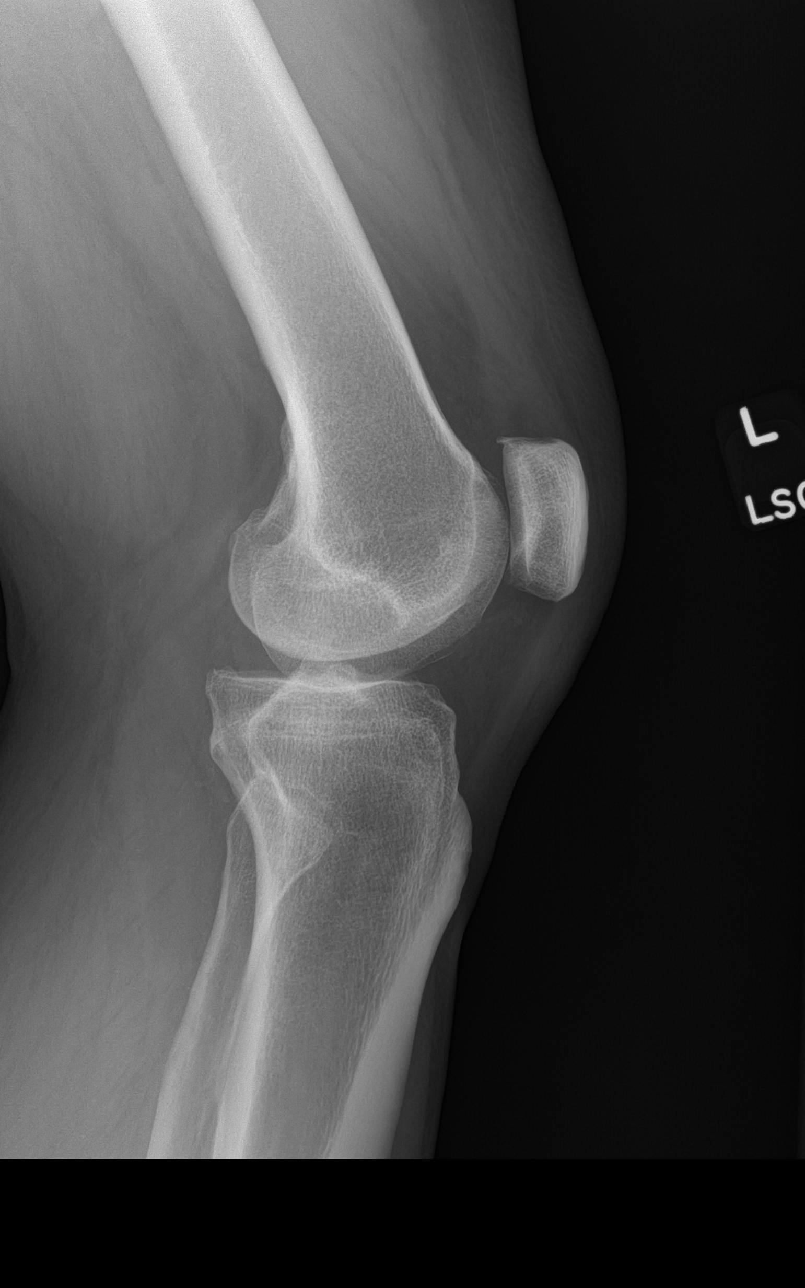

[patella skyline]
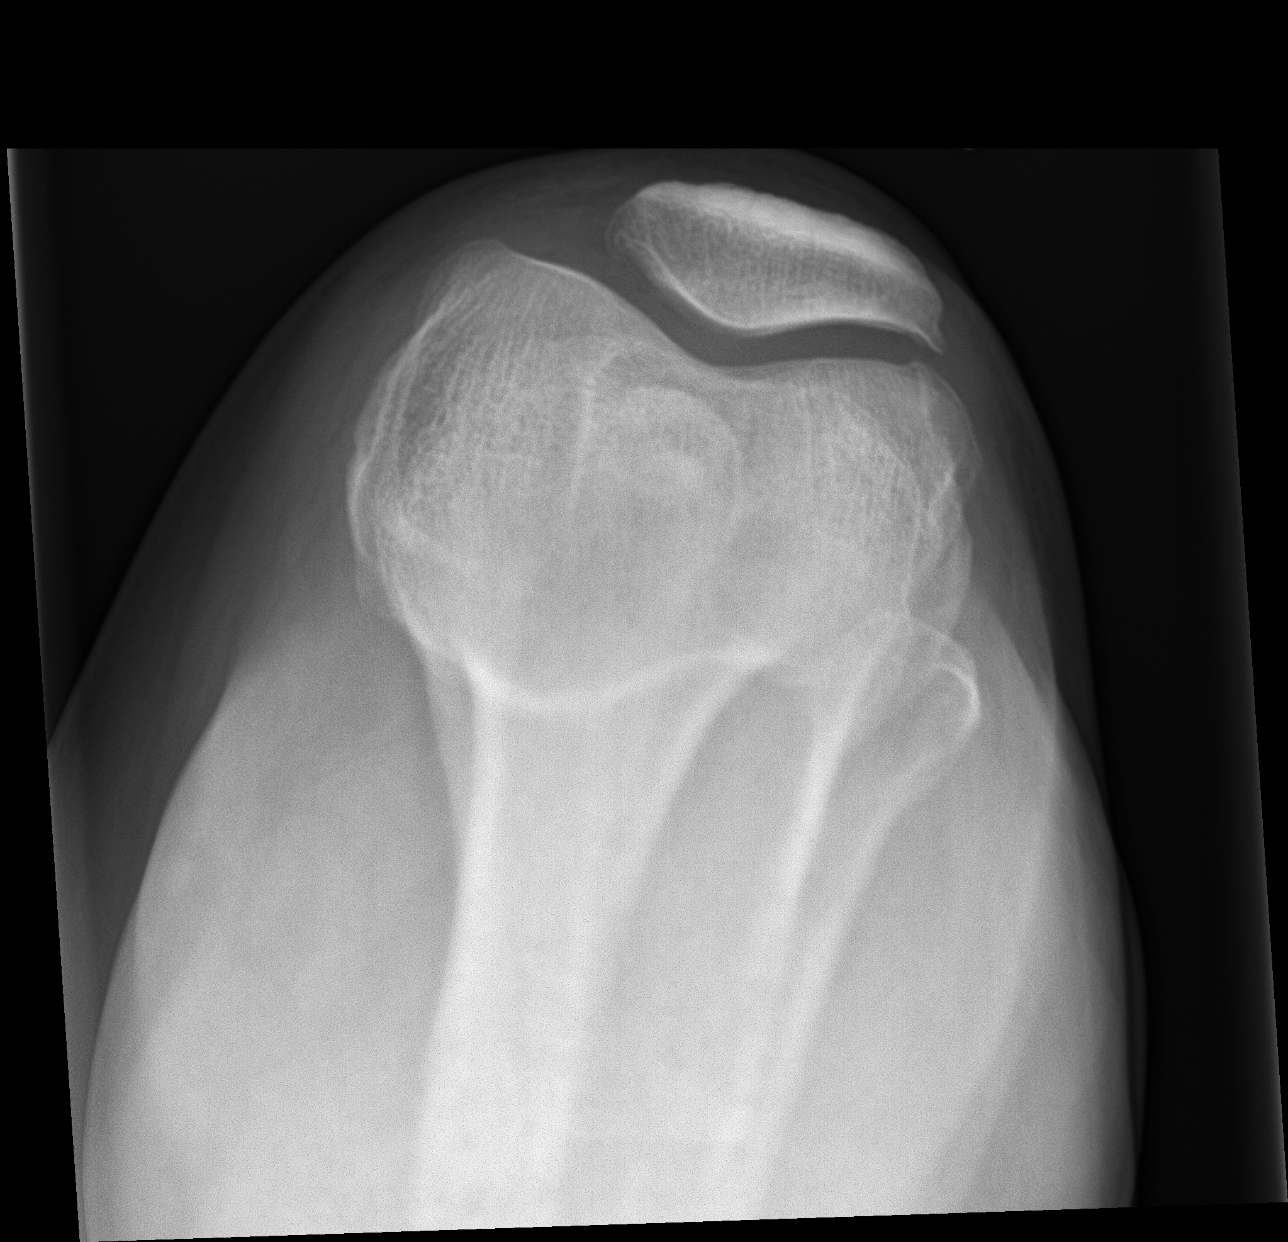

[4 of 4 positions shown; findings below may reference images not displayed]

FINDINGS: Standing views of the knees show well preserved knee joint spaces
bilaterally. Additional views show no fracture and no left knee
joint effusion is noted.
IMPRESSION: Negative.

## 2019-11-09 ENCOUNTER — Other Ambulatory Visit: Payer: Self-pay

## 2019-11-09 ENCOUNTER — Encounter: Payer: Self-pay | Admitting: Primary Care

## 2019-11-09 ENCOUNTER — Ambulatory Visit (INDEPENDENT_AMBULATORY_CARE_PROVIDER_SITE_OTHER): Payer: Medicare HMO | Admitting: Primary Care

## 2019-11-09 VITALS — BP 118/68 | HR 92 | Ht 72.0 in | Wt 296.0 lb

## 2019-11-09 DIAGNOSIS — F329 Major depressive disorder, single episode, unspecified: Secondary | ICD-10-CM

## 2019-11-09 DIAGNOSIS — E119 Type 2 diabetes mellitus without complications: Secondary | ICD-10-CM

## 2019-11-09 DIAGNOSIS — I10 Essential (primary) hypertension: Secondary | ICD-10-CM | POA: Diagnosis not present

## 2019-11-09 DIAGNOSIS — E1141 Type 2 diabetes mellitus with diabetic mononeuropathy: Secondary | ICD-10-CM

## 2019-11-09 DIAGNOSIS — E785 Hyperlipidemia, unspecified: Secondary | ICD-10-CM

## 2019-11-09 DIAGNOSIS — F32A Depression, unspecified: Secondary | ICD-10-CM

## 2019-11-09 DIAGNOSIS — Z1159 Encounter for screening for other viral diseases: Secondary | ICD-10-CM | POA: Diagnosis not present

## 2019-11-09 DIAGNOSIS — K219 Gastro-esophageal reflux disease without esophagitis: Secondary | ICD-10-CM

## 2019-11-09 DIAGNOSIS — Z114 Encounter for screening for human immunodeficiency virus [HIV]: Secondary | ICD-10-CM

## 2019-11-09 DIAGNOSIS — F419 Anxiety disorder, unspecified: Secondary | ICD-10-CM

## 2019-11-09 LAB — COMPREHENSIVE METABOLIC PANEL WITH GFR
ALT: 25 U/L (ref 0–53)
AST: 21 U/L (ref 0–37)
Albumin: 4.5 g/dL (ref 3.5–5.2)
Alkaline Phosphatase: 63 U/L (ref 39–117)
BUN: 17 mg/dL (ref 6–23)
CO2: 25 meq/L (ref 19–32)
Calcium: 9.6 mg/dL (ref 8.4–10.5)
Chloride: 103 meq/L (ref 96–112)
Creatinine, Ser: 1.12 mg/dL (ref 0.40–1.50)
GFR: 68.09 mL/min
Glucose, Bld: 129 mg/dL — ABNORMAL HIGH (ref 70–99)
Potassium: 4 meq/L (ref 3.5–5.1)
Sodium: 140 meq/L (ref 135–145)
Total Bilirubin: 1.1 mg/dL (ref 0.2–1.2)
Total Protein: 7.7 g/dL (ref 6.0–8.3)

## 2019-11-09 LAB — LIPID PANEL
Cholesterol: 101 mg/dL (ref 0–200)
HDL: 41.6 mg/dL (ref >39.00)
LDL Cholesterol: 35 mg/dL (ref 0–99)
NonHDL: 59.88
Total CHOL/HDL Ratio: 2
Triglycerides: 123 mg/dL (ref 0.0–149.0)
VLDL: 24.6 mg/dL (ref 0.0–40.0)

## 2019-11-09 LAB — HEMOGLOBIN A1C: Hgb A1c MFr Bld: 8.7 % — ABNORMAL HIGH (ref 4.6–6.5)

## 2019-11-09 MED ORDER — METFORMIN HCL 1000 MG PO TABS: 1000.0000 mg | ORAL_TABLET | Freq: Two times a day (BID) | ORAL | 3 refills | Status: DC

## 2019-11-09 MED ORDER — LISINOPRIL 10 MG PO TABS: 10.0000 mg | ORAL_TABLET | Freq: Every day | ORAL | 3 refills | Status: DC

## 2019-11-09 MED ORDER — GABAPENTIN 100 MG PO CAPS: 100.0000 mg | ORAL_CAPSULE | Freq: Two times a day (BID) | ORAL | 3 refills | Status: DC

## 2019-11-09 MED ORDER — FLUOXETINE HCL 40 MG PO CAPS: ORAL_CAPSULE | ORAL | 3 refills | Status: DC

## 2019-11-09 MED ORDER — ATORVASTATIN CALCIUM 20 MG PO TABS: ORAL_TABLET | ORAL | 3 refills | Status: DC

## 2019-11-09 MED ORDER — INSULIN NPH ISOPHANE & REGULAR (70-30) 100 UNIT/ML ~~LOC~~ SUSP: 30.0000 [IU] | Freq: Two times a day (BID) | SUBCUTANEOUS | 5 refills | Status: DC

## 2019-11-09 MED ORDER — JARDIANCE 25 MG PO TABS: 25.0000 mg | ORAL_TABLET | Freq: Every day | ORAL | 3 refills | Status: DC

## 2019-11-09 NOTE — Assessment & Plan Note (Signed)
Intermittent symptoms twice weekly on average. Using Pepcid PRN. Continue same.

## 2019-11-09 NOTE — Assessment & Plan Note (Signed)
Compliant to atorvastatin, repeat lipids pending. Continue same.

## 2019-11-09 NOTE — Progress Notes (Signed)
Subjective:    Patient ID: Scott Rice, male    DOB: 12/31/1965, 54 y.o.   MRN: [id number]  HPI  This visit occurred during the SARS-CoV-2 public health emergency.  Safety protocols were in place, including screening questions prior to the visit, additional usage of staff PPE, and extensive cleaning of exam room while observing appropriate contact time as indicated for disinfecting solutions.   Scott Rice is a 54 year old male with a history of hypertension, GERD, type 2 diabetes, diabetic neuropathy, anxiety and depression, hyperlipidemia who presents today for follow up.  1) Hypertension: Currently managed on lisinopril 10 mg. He denies chest pain, dizziness.   BP Readings from Last 3 Encounters:  11/09/19 118/68  02/13/19 132/82  05/16/18 122/82     2) Type 2 Diabetes:  Current medications include: Metformin 1000 mg BID, Jardiance 25 mg, Novolin 70/30, 25-30 units BID. Previously following with endocrinology.   He resumed his Jardiance 25 mg two days ago. He's not been taking any of his diabetes medications regularly due to lack of financial stability.   He is also managed on gabapentin 100 mg for which he typically takes twice daily with improvement.   He is checking his blood glucose 1 times daily and is getting readings of:  AM fasting: 140's-170's  Last A1C: 6.0 in October 2020, due today Last Eye Exam: Completed in December 2020 Last Foot Exam: UtD Pneumonia Vaccination: Completed in 2018 ACE/ARB: Lisinopril  Statin: Lipitor   3) Anxiety and Depression: Currently managed on fluoxetine 40 mg and is doing well. No longer on Trazodone and is doing well off. He denies SI/HI.   Review of Systems  Eyes: Negative for visual disturbance.  Respiratory: Negative for shortness of breath.   Cardiovascular: Negative for chest pain.  Neurological: Positive for numbness. Negative for dizziness and headaches.  Psychiatric/Behavioral: Negative for suicidal ideas. The  patient is not nervous/anxious.        Past Medical History:  Diagnosis Date  . Anxiety and depression   . Diabetic neuropathy ([facility])   . Essential hypertension   . Hyperlipidemia   . Type 2 diabetes mellitus ([facility])      Social History   Socioeconomic History  . Marital status: Married    Spouse name: Not on file  . Number of children: Not on file  . Years of education: Not on file  . Highest education level: Not on file  Occupational History  . Not on file  Tobacco Use  . Smoking status: Former Games developer  . Smokeless tobacco: Never Used  . Tobacco comment: quit 40 years ago  Substance and Sexual Activity  . Alcohol use: No  . Drug use: No  . Sexual activity: Not on file  Other Topics Concern  . Not on file  Social History Narrative   Married.   No children.   Works as an Event organiser.   Enjoys watching movies, spending time with family.    Social Determinants of Health   Financial Resource Strain:   . Difficulty of Paying Living Expenses: Not on file  Food Insecurity:   . Worried About Programme researcher, broadcasting/film/video in the Last Year: Not on file  . Ran Out of Food in the Last Year: Not on file  Transportation Needs:   . Lack of Transportation (Medical): Not on file  . Lack of Transportation (Non-Medical): Not on file  Physical Activity:   . Days of Exercise per Week: Not on file  . Minutes  of Exercise per Session: Not on file  Stress:   . Feeling of Stress : Not on file  Social Connections:   . Frequency of Communication with Friends and Family: Not on file  . Frequency of Social Gatherings with Friends and Family: Not on file  . Attends Religious Services: Not on file  . Active Member of Clubs or Organizations: Not on file  . Attends Banker Meetings: Not on file  . Marital Status: Not on file  Intimate Partner Violence:   . Fear of Current or Ex-Partner: Not on file  . Emotionally Abused: Not on file  . Physically Abused: Not on file  .  Sexually Abused: Not on file    No past surgical history on file.  No family history on file.  No Known Allergies  Current Outpatient Medications on File Prior to Visit  Medication Sig Dispense Refill  . B Complex-C (SUPER B COMPLEX PO) Take by mouth 2 (two) times daily.    . Calcium Carbonate-Vitamin D (CALCIUM 600/VITAMIN D PO) Take by mouth daily.    . Insulin Syringe-Needle U-100 (INSULIN SYRINGE 1CC/30GX5/16") 30G X 5/16" 1 ML MISC Inject twice daily with insulin. 100 each 5   No current facility-administered medications on file prior to visit.    BP 118/68   Pulse 92   Ht 6' (1.829 m)   Wt 296 lb (134.3 kg)   SpO2 98%   BMI 40.14 kg/m    Objective:   Physical Exam Cardiovascular:     Rate and Rhythm: Normal rate and regular rhythm.  Pulmonary:     Effort: Pulmonary effort is normal.     Breath sounds: Normal breath sounds.  Musculoskeletal:     Cervical back: Neck supple.  Skin:    General: Skin is warm and dry.  Psychiatric:        Mood and Affect: Mood normal.            Assessment & Plan:

## 2019-11-09 NOTE — Assessment & Plan Note (Signed)
Doing well on fluoxetine 40 mg, continue same.  No longer taking trazodone.

## 2019-11-09 NOTE — Patient Instructions (Signed)
Stop by the lab prior to leaving today. I will notify you of your results once received.   Start exercising. You should be getting 150 minutes of moderate intensity exercise weekly.  It's important to improve your diet by reducing consumption of fast food, fried food, processed snack foods, sugary drinks. Increase consumption of fresh vegetables and fruits, whole grains, water.  Ensure you are drinking 64 ounces of water daily.  Please schedule a follow up appointment in 6 months for a wellness visit and physical.   It was a pleasure to see you today!   Diabetes Mellitus and Nutrition, Adult When you have diabetes (diabetes mellitus), it is very important to have healthy eating habits because your blood sugar (glucose) levels are greatly affected by what you eat and drink. Eating healthy foods in the appropriate amounts, at about the same times every day, can help you:  Control your blood glucose.  Lower your risk of heart disease.  Improve your blood pressure.  Reach or maintain a healthy weight. Every person with diabetes is different, and each person has different needs for a meal plan. Your health care provider may recommend that you work with a diet and nutrition specialist (dietitian) to make a meal plan that is best for you. Your meal plan may vary depending on factors such as:  The calories you need.  The medicines you take.  Your weight.  Your blood glucose, blood pressure, and cholesterol levels.  Your activity level.  Other health conditions you have, such as heart or kidney disease. How do carbohydrates affect me? Carbohydrates, also called carbs, affect your blood glucose level more than any other type of food. Eating carbs naturally raises the amount of glucose in your blood. Carb counting is a method for keeping track of how many carbs you eat. Counting carbs is important to keep your blood glucose at a healthy level, especially if you use insulin or take certain  oral diabetes medicines. It is important to know how many carbs you can safely have in each meal. This is different for every person. Your dietitian can help you calculate how many carbs you should have at each meal and for each snack. Foods that contain carbs include:  Bread, cereal, rice, pasta, and crackers.  Potatoes and corn.  Peas, beans, and lentils.  Milk and yogurt.  Fruit and juice.  Desserts, such as cakes, cookies, ice cream, and candy. How does alcohol affect me? Alcohol can cause a sudden decrease in blood glucose (hypoglycemia), especially if you use insulin or take certain oral diabetes medicines. Hypoglycemia can be a life-threatening condition. Symptoms of hypoglycemia (sleepiness, dizziness, and confusion) are similar to symptoms of having too much alcohol. If your health care provider says that alcohol is safe for you, follow these guidelines:  Limit alcohol intake to no more than 1 drink per day for nonpregnant women and 2 drinks per day for men. One drink equals 12 oz of beer, 5 oz of wine, or 1 oz of hard liquor.  Do not drink on an empty stomach.  Keep yourself hydrated with water, diet soda, or unsweetened iced tea.  Keep in mind that regular soda, juice, and other mixers may contain a lot of sugar and must be counted as carbs. What are tips for following this plan?  Reading food labels  Start by checking the serving size on the "Nutrition Facts" label of packaged foods and drinks. The amount of calories, carbs, fats, and other nutrients listed on  the label is based on one serving of the item. Many items contain more than one serving per package.  Check the total grams (g) of carbs in one serving. You can calculate the number of servings of carbs in one serving by dividing the total carbs by 15. For example, if a food has 30 g of total carbs, it would be equal to 2 servings of carbs.  Check the number of grams (g) of saturated and trans fats in one serving.  Choose foods that have low or no amount of these fats.  Check the number of milligrams (mg) of salt (sodium) in one serving. Most people should limit total sodium intake to less than 2,300 mg per day.  Always check the nutrition information of foods labeled as "low-fat" or "nonfat". These foods may be higher in added sugar or refined carbs and should be avoided.  Talk to your dietitian to identify your daily goals for nutrients listed on the label. Shopping  Avoid buying canned, premade, or processed foods. These foods tend to be high in fat, sodium, and added sugar.  Shop around the outside edge of the grocery store. This includes fresh fruits and vegetables, bulk grains, fresh meats, and fresh dairy. Cooking  Use low-heat cooking methods, such as baking, instead of high-heat cooking methods like deep frying.  Cook using healthy oils, such as olive, canola, or sunflower oil.  Avoid cooking with butter, cream, or high-fat meats. Meal planning  Eat meals and snacks regularly, preferably at the same times every day. Avoid going long periods of time without eating.  Eat foods high in fiber, such as fresh fruits, vegetables, beans, and whole grains. Talk to your dietitian about how many servings of carbs you can eat at each meal.  Eat 4-6 ounces (oz) of lean protein each day, such as lean meat, chicken, fish, eggs, or tofu. One oz of lean protein is equal to: ? 1 oz of meat, chicken, or fish. ? 1 egg. ?  cup of tofu.  Eat some foods each day that contain healthy fats, such as avocado, nuts, seeds, and fish. Lifestyle  Check your blood glucose regularly.  Exercise regularly as told by your health care provider. This may include: ? 150 minutes of moderate-intensity or vigorous-intensity exercise each week. This could be brisk walking, biking, or water aerobics. ? Stretching and doing strength exercises, such as yoga or weightlifting, at least 2 times a week.  Take medicines as told  by your health care provider.  Do not use any products that contain nicotine or tobacco, such as cigarettes and e-cigarettes. If you need help quitting, ask your health care provider.  Work with a Veterinary surgeon or diabetes educator to identify strategies to manage stress and any emotional and social challenges. Questions to ask a health care provider  Do I need to meet with a diabetes educator?  Do I need to meet with a dietitian?  What number can I call if I have questions?  When are the best times to check my blood glucose? Where to find more information:  American Diabetes Association: diabetes.org  Academy of Nutrition and Dietetics: www.eatright.[employer] of Diabetes and Digestive and Kidney Diseases (NIH): CarFlippers.tn Summary  A healthy meal plan will help you control your blood glucose and maintain a healthy lifestyle.  Working with a diet and nutrition specialist (dietitian) can help you make a meal plan that is best for you.  Keep in mind that carbohydrates (carbs) and alcohol  have immediate effects on your blood glucose levels. It is important to count carbs and to use alcohol carefully. This information is not intended to replace advice given to you by your health care provider. Make sure you discuss any questions you have with your health care provider. Document Revised: 02/11/2017 Document Reviewed: 04/05/2016 Elsevier Patient Education  2020 [employer].

## 2019-11-09 NOTE — Assessment & Plan Note (Signed)
Well controlled in the office today. Continue lisinopril.  CMP pending.

## 2019-11-09 NOTE — Assessment & Plan Note (Signed)
Doing well on gabapentin 100 mg for which he takes BID. Refills provided. Continue same.

## 2019-11-09 NOTE — Assessment & Plan Note (Signed)
No longer following with endocrinology as he cannot afford.  Repeat A1C pending.  He's also been stretching out all of his diabetic medications due to financial constraints. Refills provided for all medications.   Foot exam UTD. Eye exam UTD. Pneumonia vaccination UTD.  Follow up in 3-6 months based on results,

## 2019-11-12 DIAGNOSIS — E119 Type 2 diabetes mellitus without complications: Secondary | ICD-10-CM

## 2019-11-12 LAB — HIV ANTIBODY (ROUTINE TESTING W REFLEX): HIV 1&2 Ab, 4th Generation: NONREACTIVE

## 2019-11-12 LAB — HEPATITIS C ANTIBODY
Hepatitis C Ab: NONREACTIVE
SIGNAL TO CUT-OFF: 0.11 (ref <1.00)

## 2019-11-12 NOTE — Telephone Encounter (Signed)
This encounter was created in error - please disregard.

## 2019-11-20 ENCOUNTER — Other Ambulatory Visit: Payer: Self-pay | Admitting: Primary Care

## 2019-11-20 DIAGNOSIS — E119 Type 2 diabetes mellitus without complications: Secondary | ICD-10-CM

## 2019-11-22 ENCOUNTER — Telehealth: Payer: Self-pay

## 2019-11-22 DIAGNOSIS — E119 Type 2 diabetes mellitus without complications: Secondary | ICD-10-CM

## 2019-11-22 NOTE — Telephone Encounter (Signed)
Left message for patient to let us know if he would like vials or flexpens for his humulin.

## 2019-11-23 MED ORDER — GABAPENTIN 100 MG PO CAPS: 100.0000 mg | ORAL_CAPSULE | Freq: Two times a day (BID) | ORAL | 0 refills | Status: DC

## 2019-11-23 MED ORDER — JARDIANCE 25 MG PO TABS: 25.0000 mg | ORAL_TABLET | Freq: Every day | ORAL | 0 refills | Status: DC

## 2019-11-23 MED ORDER — METFORMIN HCL 1000 MG PO TABS: 1000.0000 mg | ORAL_TABLET | Freq: Two times a day (BID) | ORAL | 0 refills | Status: DC

## 2019-12-16 ENCOUNTER — Other Ambulatory Visit: Payer: Self-pay | Admitting: Primary Care

## 2019-12-16 DIAGNOSIS — E119 Type 2 diabetes mellitus without complications: Secondary | ICD-10-CM

## 2020-02-14 ENCOUNTER — Other Ambulatory Visit: Payer: Self-pay | Admitting: Primary Care

## 2020-02-14 DIAGNOSIS — E119 Type 2 diabetes mellitus without complications: Secondary | ICD-10-CM

## 2020-02-18 ENCOUNTER — Other Ambulatory Visit (INDEPENDENT_AMBULATORY_CARE_PROVIDER_SITE_OTHER): Payer: Medicare HMO

## 2020-02-18 ENCOUNTER — Other Ambulatory Visit: Payer: Self-pay

## 2020-02-18 DIAGNOSIS — E119 Type 2 diabetes mellitus without complications: Secondary | ICD-10-CM | POA: Diagnosis not present

## 2020-02-18 LAB — POCT GLYCOSYLATED HEMOGLOBIN (HGB A1C): Hemoglobin A1C: 8.1 % — AB (ref 4.0–5.6)

## 2020-04-07 DIAGNOSIS — E119 Type 2 diabetes mellitus without complications: Secondary | ICD-10-CM

## 2020-04-10 ENCOUNTER — Other Ambulatory Visit: Payer: Self-pay | Admitting: Primary Care

## 2020-04-10 DIAGNOSIS — E119 Type 2 diabetes mellitus without complications: Secondary | ICD-10-CM

## 2020-04-10 MED ORDER — HUMULIN 70/30 (70-30) 100 UNIT/ML ~~LOC~~ SUSP: 32.0000 [IU] | Freq: Two times a day (BID) | SUBCUTANEOUS | 2 refills | Status: DC

## 2020-04-10 NOTE — Telephone Encounter (Signed)
Please advise 

## 2020-04-13 IMAGING — DX DG SHOULDER 2+V*R*
3 series · 3 of 3 positions shown · non-contrast
Comparison: 04/13/2010 right shoulder radiographs

CLINICAL DATA: Acute on chronic right shoulder pain, fall last
night

EXAM:
RIGHT SHOULDER - 2+ VIEW

[shoulder axial]
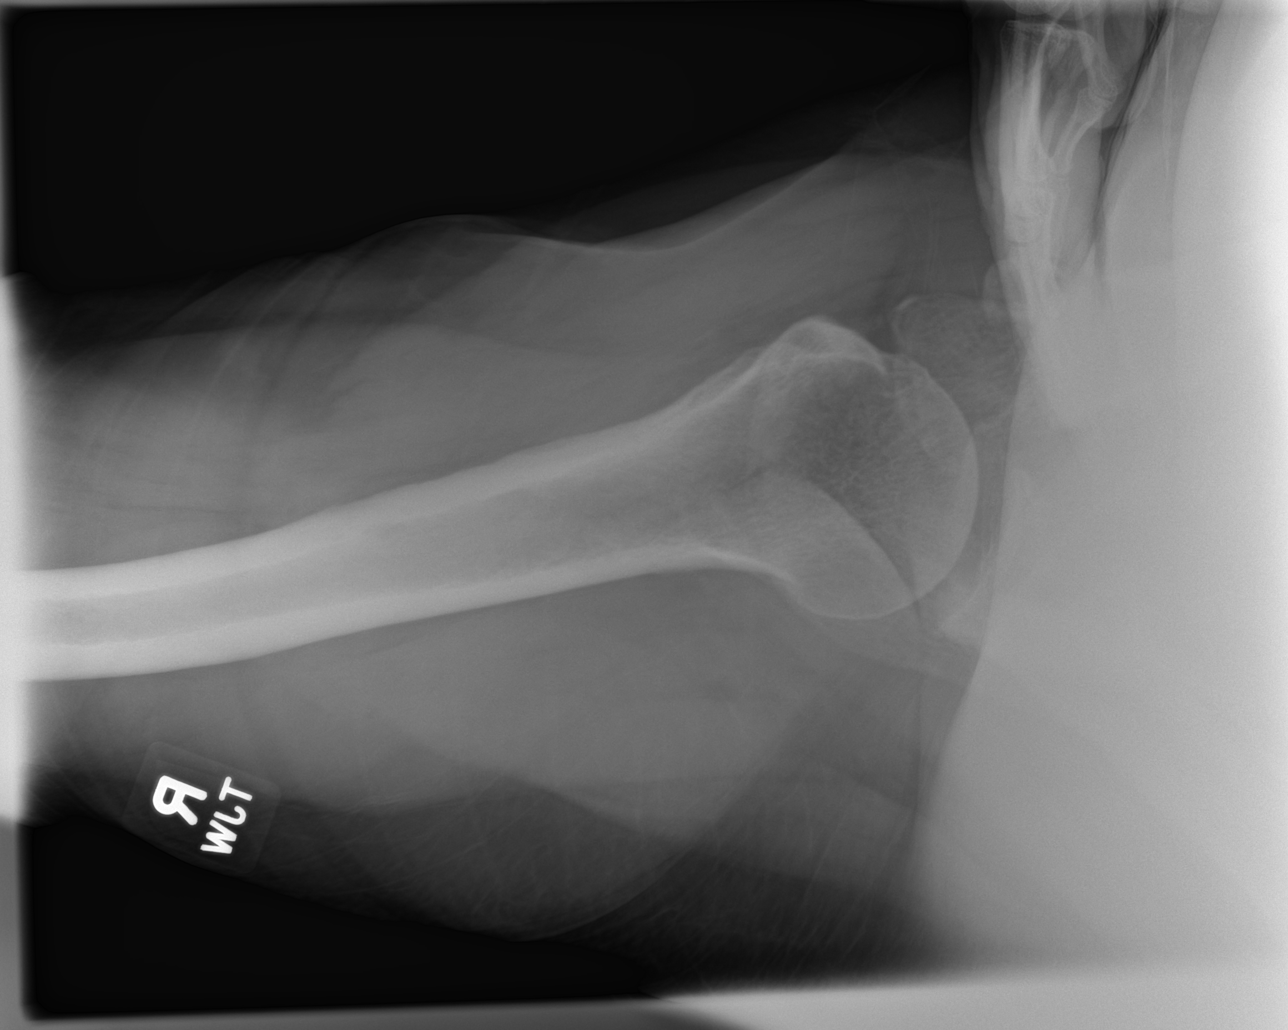

[shoulder ap]
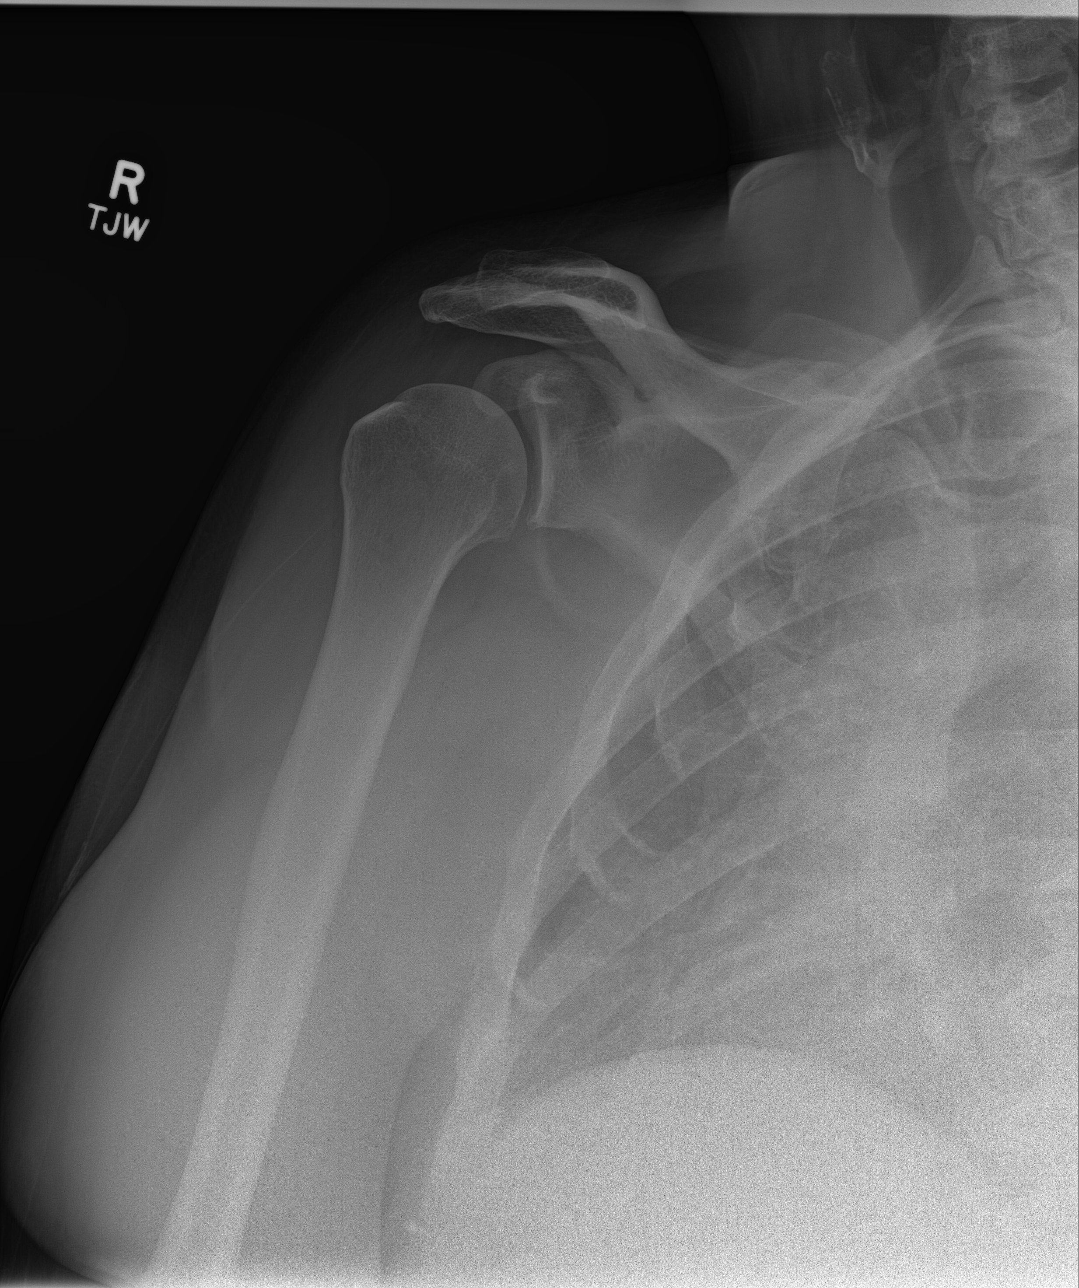

[shoulder y-view]
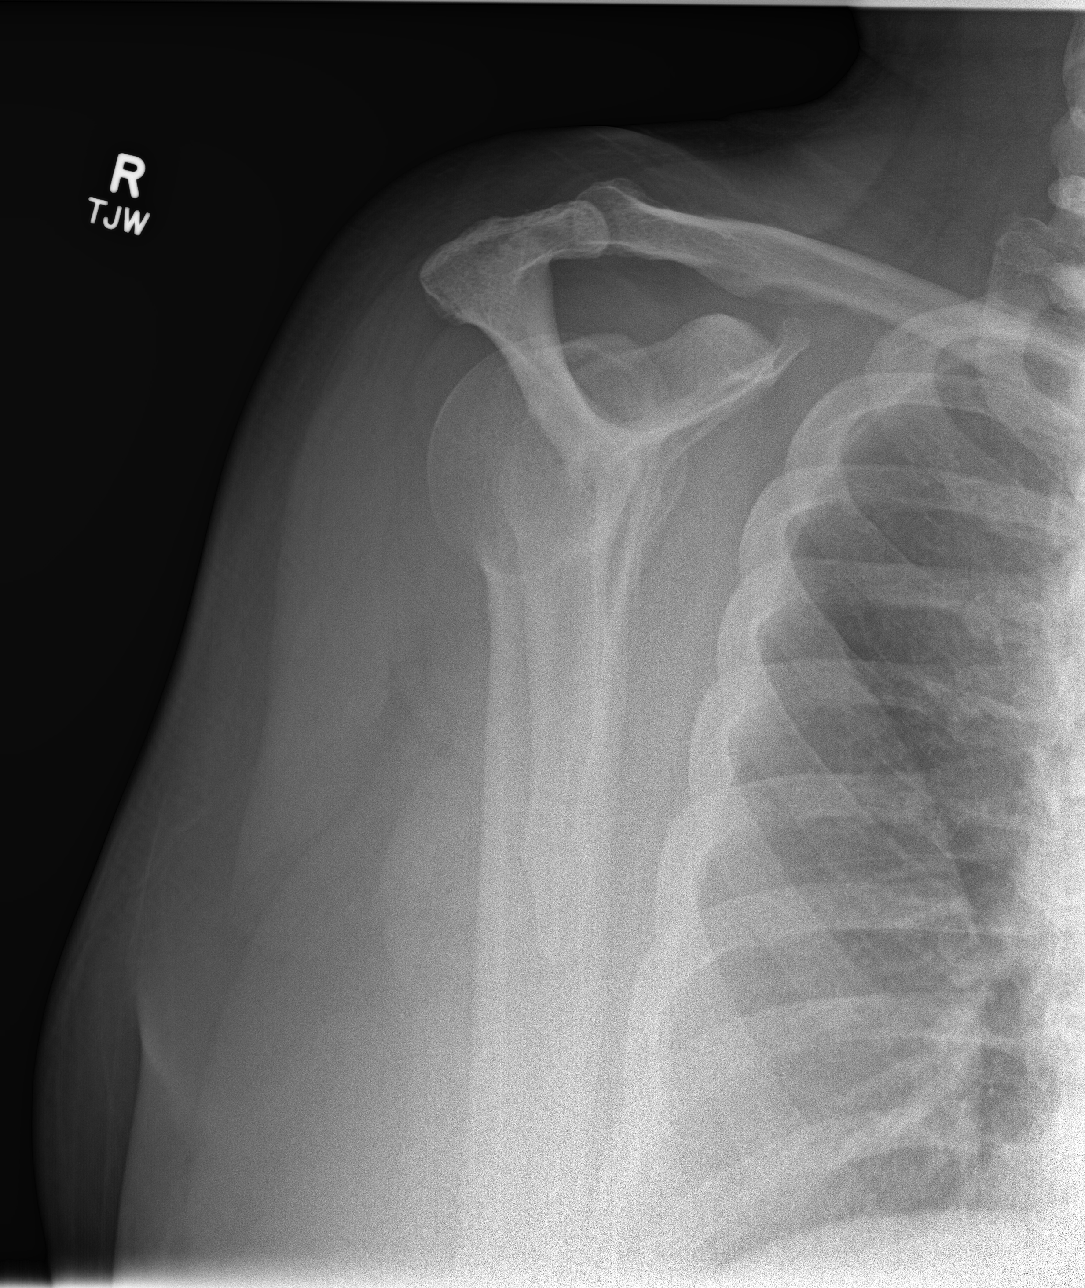

[3 of 3 positions shown; findings below may reference images not displayed]

FINDINGS: No fracture. No glenohumeral dislocation. No evidence of
acromioclavicular separation. No suspicious focal osseous lesions.
Mild inferior right glenohumeral osteoarthritis. No radiopaque
foreign body or pathologic soft tissue calcification.
IMPRESSION: No right shoulder fracture or malalignment. Mild inferior right
glenohumeral osteoarthritis.

## 2020-04-14 NOTE — Telephone Encounter (Signed)
Noted, changed to Novolin 70/30 as Humulin was not covered.

## 2020-05-08 ENCOUNTER — Other Ambulatory Visit: Payer: Self-pay | Admitting: Primary Care

## 2020-05-08 DIAGNOSIS — Z125 Encounter for screening for malignant neoplasm of prostate: Secondary | ICD-10-CM

## 2020-05-08 DIAGNOSIS — E785 Hyperlipidemia, unspecified: Secondary | ICD-10-CM

## 2020-05-08 DIAGNOSIS — E119 Type 2 diabetes mellitus without complications: Secondary | ICD-10-CM

## 2020-05-08 DIAGNOSIS — I10 Essential (primary) hypertension: Secondary | ICD-10-CM

## 2020-05-13 ENCOUNTER — Other Ambulatory Visit (INDEPENDENT_AMBULATORY_CARE_PROVIDER_SITE_OTHER): Payer: Medicare HMO

## 2020-05-13 ENCOUNTER — Other Ambulatory Visit: Payer: Self-pay

## 2020-05-13 DIAGNOSIS — E119 Type 2 diabetes mellitus without complications: Secondary | ICD-10-CM | POA: Diagnosis not present

## 2020-05-13 DIAGNOSIS — Z125 Encounter for screening for malignant neoplasm of prostate: Secondary | ICD-10-CM

## 2020-05-13 DIAGNOSIS — I10 Essential (primary) hypertension: Secondary | ICD-10-CM

## 2020-05-13 DIAGNOSIS — E785 Hyperlipidemia, unspecified: Secondary | ICD-10-CM | POA: Diagnosis not present

## 2020-05-13 LAB — COMPREHENSIVE METABOLIC PANEL
ALT: 22 U/L (ref 0–53)
AST: 16 U/L (ref 0–37)
Albumin: 4.5 g/dL (ref 3.5–5.2)
Alkaline Phosphatase: 57 U/L (ref 39–117)
BUN: 19 mg/dL (ref 6–23)
CO2: 27 mEq/L (ref 19–32)
Calcium: 9.4 mg/dL (ref 8.4–10.5)
Chloride: 103 mEq/L (ref 96–112)
Creatinine, Ser: 0.94 mg/dL (ref 0.40–1.50)
GFR: 91.31 mL/min (ref >60.00)
Glucose, Bld: 104 mg/dL — ABNORMAL HIGH (ref 70–99)
Potassium: 3.9 mEq/L (ref 3.5–5.1)
Sodium: 138 mEq/L (ref 135–145)
Total Bilirubin: 0.5 mg/dL (ref 0.2–1.2)
Total Protein: 7.1 g/dL (ref 6.0–8.3)

## 2020-05-13 LAB — CBC
HCT: 43.8 % (ref 39.0–52.0)
Hemoglobin: 14.7 g/dL (ref 13.0–17.0)
MCHC: 33.5 g/dL (ref 30.0–36.0)
MCV: 85.9 fl (ref 78.0–100.0)
Platelets: 223 10*3/uL (ref 150.0–400.0)
RBC: 5.1 Mil/uL (ref 4.22–5.81)
RDW: 15.2 % (ref 11.5–15.5)
WBC: 7.3 10*3/uL (ref 4.0–10.5)

## 2020-05-13 LAB — LIPID PANEL
Cholesterol: 105 mg/dL (ref 0–200)
HDL: 42.5 mg/dL (ref >39.00)
LDL Cholesterol: 32 mg/dL (ref 0–99)
NonHDL: 62.7
Total CHOL/HDL Ratio: 2
Triglycerides: 153 mg/dL — ABNORMAL HIGH (ref 0.0–149.0)
VLDL: 30.6 mg/dL (ref 0.0–40.0)

## 2020-05-13 LAB — PSA: PSA: 0.21 ng/mL (ref 0.10–4.00)

## 2020-05-13 LAB — HEMOGLOBIN A1C: Hgb A1c MFr Bld: 8.3 % — ABNORMAL HIGH (ref 4.6–6.5)

## 2020-05-20 ENCOUNTER — Other Ambulatory Visit: Payer: Self-pay

## 2020-05-20 ENCOUNTER — Ambulatory Visit (INDEPENDENT_AMBULATORY_CARE_PROVIDER_SITE_OTHER): Payer: Medicare HMO | Admitting: Primary Care

## 2020-05-20 ENCOUNTER — Encounter: Payer: Self-pay | Admitting: Primary Care

## 2020-05-20 VITALS — BP 122/80 | HR 85 | Temp 97.8°F | Ht 72.0 in | Wt 303.5 lb

## 2020-05-20 DIAGNOSIS — Z0000 Encounter for general adult medical examination without abnormal findings: Secondary | ICD-10-CM | POA: Diagnosis not present

## 2020-05-20 DIAGNOSIS — F32A Depression, unspecified: Secondary | ICD-10-CM

## 2020-05-20 DIAGNOSIS — E1141 Type 2 diabetes mellitus with diabetic mononeuropathy: Secondary | ICD-10-CM | POA: Diagnosis not present

## 2020-05-20 DIAGNOSIS — E119 Type 2 diabetes mellitus without complications: Secondary | ICD-10-CM | POA: Diagnosis not present

## 2020-05-20 DIAGNOSIS — I10 Essential (primary) hypertension: Secondary | ICD-10-CM

## 2020-05-20 DIAGNOSIS — Z23 Encounter for immunization: Secondary | ICD-10-CM

## 2020-05-20 DIAGNOSIS — K219 Gastro-esophageal reflux disease without esophagitis: Secondary | ICD-10-CM

## 2020-05-20 DIAGNOSIS — F419 Anxiety disorder, unspecified: Secondary | ICD-10-CM

## 2020-05-20 DIAGNOSIS — E785 Hyperlipidemia, unspecified: Secondary | ICD-10-CM

## 2020-05-20 DIAGNOSIS — Z1211 Encounter for screening for malignant neoplasm of colon: Secondary | ICD-10-CM

## 2020-05-20 MED ORDER — INSULIN SYRINGE 30G X 5/16" 1 ML MISC
3 refills | Status: DC
Start: 2020-05-20 — End: 2022-01-12

## 2020-05-20 MED ORDER — JARDIANCE 25 MG PO TABS: 25.0000 mg | ORAL_TABLET | Freq: Every day | ORAL | 3 refills | Status: DC

## 2020-05-20 MED ORDER — METFORMIN HCL 1000 MG PO TABS
1000.0000 mg | ORAL_TABLET | Freq: Two times a day (BID) | ORAL | 3 refills | Status: DC
Start: 2020-05-20 — End: 2021-07-26

## 2020-05-20 MED ORDER — TRAZODONE HCL 50 MG PO TABS: 25.0000 mg | ORAL_TABLET | Freq: Every evening | ORAL | 0 refills | Status: DC | PRN

## 2020-05-20 MED ORDER — GABAPENTIN 300 MG PO CAPS
300.0000 mg | ORAL_CAPSULE | Freq: Every day | ORAL | 3 refills | Status: DC
Start: 2020-05-20 — End: 2020-09-24

## 2020-05-20 MED ORDER — NOVOLIN 70/30 FLEXPEN (70-30) 100 UNIT/ML ~~LOC~~ SUPN
PEN_INJECTOR | SUBCUTANEOUS | 5 refills | Status: DC
Start: 2020-05-20 — End: 2020-10-27

## 2020-05-20 MED ORDER — ZOSTER VAC RECOMB ADJUVANTED 50 MCG/0.5ML IM SUSR: 0.5000 mL | Freq: Once | INTRAMUSCULAR | 1 refills | Status: AC

## 2020-05-20 NOTE — Patient Instructions (Signed)
Start exercising. You should be getting 150 minutes of moderate intensity exercise weekly.  It is important that you improve your diet. Please limit carbohydrates in the form of white bread, rice, pasta, sweets, fast food, fried food, sugary drinks, etc. Increase your consumption of fresh fruits and vegetables, whole grains, lean protein.  Ensure you are consuming 64 ounces of water daily.  Complete the Cologuard kit as discussed.  Take the shingles vaccines to the pharmacy as discussed  Schedule lab appointment for 3 months to repeat your A1c.  Schedule follow-up visit with me for 6 months for diabetes check.  It was a pleasure to see you today!   Preventive Care 56-25 Years Old, Male Preventive care refers to lifestyle choices and visits with your health care provider that can promote health and wellness. This includes:  A yearly physical exam. This is also called an annual wellness visit.  Regular dental and eye exams.  Immunizations.  Screening for certain conditions.  Healthy lifestyle choices, such as: ? Eating a healthy diet. ? Getting regular exercise. ? Not using drugs or products that contain nicotine and tobacco. ? Limiting alcohol use. What can I expect for my preventive care visit? Physical exam Your health care provider will check your:  Height and weight. These may be used to calculate your BMI (body mass index). BMI is a measurement that tells if you are at a healthy weight.  Heart rate and blood pressure.  Body temperature.  Skin for abnormal spots. Counseling Your health care provider may ask you questions about your:  Past medical problems.  Family's medical history.  Alcohol, tobacco, and drug use.  Emotional well-being.  Home life and relationship well-being.  Sexual activity.  Diet, exercise, and sleep habits.  Work and work Statistician.  Access to firearms. What immunizations do I need? Vaccines are usually given at various ages,  according to a schedule. Your health care provider will recommend vaccines for you based on your age, medical history, and lifestyle or other factors, such as travel or where you work.   What tests do I need? Blood tests  Lipid and cholesterol levels. These may be checked every 5 years, or more often if you are over 37 years old.  Hepatitis C test.  Hepatitis B test. Screening  Lung cancer screening. You may have this screening every year starting at age 10 if you have a 30-pack-year history of smoking and currently smoke or have quit within the past 15 years.  Prostate cancer screening. Recommendations will vary depending on your family history and other risks.  Genital exam to check for testicular cancer or hernias.  Colorectal cancer screening. ? All adults should have this screening starting at age 45 and continuing until age 7. ? Your health care provider may recommend screening at age 40 if you are at increased risk. ? You will have tests every 1-10 years, depending on your results and the type of screening test.  Diabetes screening. ? This is done by checking your blood sugar (glucose) after you have not eaten for a while (fasting). ? You may have this done every 1-3 years.  STD (sexually transmitted disease) testing, if you are at risk. Follow these instructions at home: Eating and drinking  Eat a diet that includes fresh fruits and vegetables, whole grains, lean protein, and low-fat dairy products.  Take vitamin and mineral supplements as recommended by your health care provider.  Do not drink alcohol if your health care provider tells you  not to drink.  If you drink alcohol: ? Limit how much you have to 0-2 drinks a day. ? Be aware of how much alcohol is in your drink. In the [place] one drink equals one 12 oz bottle of beer (355 mL), one 5 oz glass of wine (148 mL), or one 1 oz glass of hard liquor (44 mL).   Lifestyle  Take daily care of your teeth and gums. Brush  your teeth every morning and night with fluoride toothpaste. Floss one time each day.  Stay active. Exercise for at least 30 minutes 5 or more days each week.  Do not use any products that contain nicotine or tobacco, such as cigarettes, e-cigarettes, and chewing tobacco. If you need help quitting, ask your health care provider.  Do not use drugs.  If you are sexually active, practice safe sex. Use a condom or other form of protection to prevent STIs (sexually transmitted infections).  If told by your health care provider, take low-dose aspirin daily starting at age 38.  Find healthy ways to cope with stress, such as: ? Meditation, yoga, or listening to music. ? Journaling. ? Talking to a trusted person. ? Spending time with friends and family. Safety  Always wear your seat belt while driving or riding in a vehicle.  Do not drive: ? If you have been drinking alcohol. Do not ride with someone who has been drinking. ? When you are tired or distracted. ? While texting.  Wear a helmet and other protective equipment during sports activities.  If you have firearms in your house, make sure you follow all gun safety procedures. What's next?  Go to your health care provider once a year for an annual wellness visit.  Ask your health care provider how often you should have your eyes and teeth checked.  Stay up to date on all vaccines. This information is not intended to replace advice given to you by your health care provider. Make sure you discuss any questions you have with your health care provider. Document Revised: 11/28/2018 Document Reviewed: 02/23/2018 Elsevier Patient Education  2021 [employer].

## 2020-05-20 NOTE — Assessment & Plan Note (Signed)
Due for Shingrix, prescription provided today.  Influenza vaccine provided today.  PSA up-to-date. Colonoscopy overdue, opts for Cologuard. Strongly advised to work on a healthy diet and regular exercise.  Exam today stable. Labs reviewed.

## 2020-05-20 NOTE — Progress Notes (Signed)
Subjective:    Patient ID: Scott Rice, male    DOB: 12/31/1965, 55 y.o.   MRN: [id number]  HPI  This visit occurred during the SARS-CoV-2 public health emergency.  Safety protocols were in place, including screening questions prior to the visit, additional usage of staff PPE, and extensive cleaning of exam room while observing appropriate contact time as indicated for disinfecting solutions.   Mr. Scott Rice is a 55 year old male who presents today for complete physical.  He would also like to discuss difficulty sleeping, increased depression. Overall feels well managed on fluoxetine 40 mg, is no longer following with therapy as he doesn't have virtual capability. Mind racing thoughts at night, not sleeping well, gabapentin doesn't cause drowsiness.   Immunizations: -Tetanus: 2020 -Influenza: Due today -Shingles: Never completed.  -Pneumonia:  2018 -Covid-19: Never completed  Diet: He endorses a poor diet. Exercise: No regular exercise  Eye exam: Completed in 2020 Dental exam: No recent exam  Colonoscopy: Overdue. Declines colonoscopy, opts for Cologuard PSA: UTD Hep C Screen: Negative   BP Readings from Last 3 Encounters:  05/20/20 122/80  11/09/19 118/68  02/13/19 132/82     Review of Systems  Constitutional: Negative for unexpected weight change.  HENT: Negative for rhinorrhea.   Eyes: Negative for visual disturbance.  Respiratory: Negative for cough and shortness of breath.   Cardiovascular: Negative for chest pain.  Gastrointestinal: Negative for constipation and diarrhea.  Genitourinary: Negative for difficulty urinating.  Musculoskeletal: Positive for arthralgias.  Skin: Negative for rash.  Allergic/Immunologic: Negative for environmental allergies.  Neurological: Negative for dizziness and headaches.  Psychiatric/Behavioral: Positive for sleep disturbance.       See HPI       Past Medical History:  Diagnosis Date  . Anxiety and depression   .  Diabetic neuropathy (HCC)   . Essential hypertension   . Hyperlipidemia   . Type 2 diabetes mellitus (HCC)      Social History   Socioeconomic History  . Marital status: Married    Spouse name: Not on file  . Number of children: Not on file  . Years of education: Not on file  . Highest education level: Not on file  Occupational History  . Not on file  Tobacco Use  . Smoking status: Former Games developer  . Smokeless tobacco: Never Used  . Tobacco comment: quit 40 years ago  Substance and Sexual Activity  . Alcohol use: No  . Drug use: No  . Sexual activity: Not on file  Other Topics Concern  . Not on file  Social History Narrative   Married.   No children.   Works as an Event organiser.   Enjoys watching movies, spending time with family.    Social Determinants of Health   Financial Resource Strain: Not on file  Food Insecurity: Not on file  Transportation Needs: Not on file  Physical Activity: Not on file  Stress: Not on file  Social Connections: Not on file  Intimate Partner Violence: Not on file    History reviewed. No pertinent surgical history.  History reviewed. No pertinent family history.  No Known Allergies  Current Outpatient Medications on File Prior to Visit  Medication Sig Dispense Refill  . atorvastatin (LIPITOR) 20 MG tablet TAKE 1 TABLET BY MOUTH EVERY DAY IN THE EVENING for cholesterol. 90 tablet 3  . B Complex-C (SUPER B COMPLEX PO) Take by mouth 2 (two) times daily.    . Calcium Carbonate-Vitamin D (CALCIUM 600/VITAMIN D PO)  Take by mouth daily.    . famotidine (PEPCID) 20 MG tablet Take 20 mg by mouth daily.    Marland Kitchen FLUoxetine (PROZAC) 40 MG capsule TAKE 1 CAPSULE BY MOUTH EVERY DAY for anxiety. 90 capsule 3  . lisinopril (ZESTRIL) 10 MG tablet Take 1 tablet (10 mg total) by mouth daily. For blood pressure. 90 tablet 3   No current facility-administered medications on file prior to visit.    BP 122/80   Pulse 85   Temp 97.8 F (36.6 C)  (Temporal)   Ht 6' (1.829 m)   Wt (!) 303 lb 8 oz (137.7 kg)   SpO2 97%   BMI 41.16 kg/m    Objective:   Physical Exam Constitutional:      Appearance: He is well-nourished.  HENT:     Right Ear: Tympanic membrane and ear canal normal.     Left Ear: Tympanic membrane and ear canal normal.     Mouth/Throat:     Mouth: Oropharynx is clear and moist.  Eyes:     Extraocular Movements: EOM normal.     Pupils: Pupils are equal, round, and reactive to light.  Cardiovascular:     Rate and Rhythm: Normal rate and regular rhythm.  Pulmonary:     Effort: Pulmonary effort is normal.     Breath sounds: Normal breath sounds.  Abdominal:     General: Bowel sounds are normal.     Palpations: Abdomen is soft.     Tenderness: There is no abdominal tenderness.  Musculoskeletal:        General: Normal range of motion.     Cervical back: Neck supple.  Skin:    General: Skin is warm and dry.  Neurological:     Mental Status: He is alert and oriented to person, place, and time.     Cranial Nerves: No cranial nerve deficit.     Deep Tendon Reflexes:     Reflex Scores:      Patellar reflexes are 2+ on the right side and 2+ on the left side. Psychiatric:        Mood and Affect: Mood and affect and mood normal.            Assessment & Plan:

## 2020-05-20 NOTE — Assessment & Plan Note (Signed)
Deteriorated. Compliant to fluoxetine 40 mg. No longer following with therapy.   Offered to reconnect with therapy vs increasing fluoxetine vs adding Trazodone HS. He opts to add Trazodone. Rx for Trazodone 50 mg sent to pharmacy, he will update.

## 2020-05-20 NOTE — Assessment & Plan Note (Signed)
Uncontrolled with recent A1C of 8.3.  He is injecting Novollin 70/30 30 units in AM and 35 units in PM for the last 2 weeks, continue same.   Continue metformin 1000 mg BID. Continue Jardiance 25 mg.   Repeat A1C in 3 months, lab appointment. Follow up in 6 months.

## 2020-05-20 NOTE — Assessment & Plan Note (Signed)
Doing well on gabapentin, has been taking 300 mg and doing better. Will change Rx for 300 mg dose.

## 2020-05-20 NOTE — Assessment & Plan Note (Signed)
Well controlled on atorvastatin 20 mg, continue same.  Recent lipid panel reviewed.

## 2020-05-20 NOTE — Assessment & Plan Note (Signed)
Well controlled in the office today, continue lisinopril 10 mg. CMP reviewed.  

## 2020-05-20 NOTE — Assessment & Plan Note (Addendum)
Intermittent, doing well on famotidine 20 mg. Continue same.

## 2020-05-29 ENCOUNTER — Telehealth: Payer: Self-pay

## 2020-05-29 NOTE — Telephone Encounter (Signed)
Whichever patient prefers. It looks like I sent in the insulin pen. Okay to send in needles if that's what's going on. It looks like the pen and syringes are ordered.

## 2020-05-29 NOTE — Telephone Encounter (Signed)
[employer] pharmacist (no individuals name left that made v/m) request cb with clarification of order; does pt need novolin flex pen and pen needles or novolin in vial with insulin syringes; which does provider want pt to have. Most recent order was novolin vials per [facility]. [employer] request cb.

## 2020-05-30 NOTE — Telephone Encounter (Signed)
Left message to return call to our office.  Need to see what patient would like

## 2020-06-10 NOTE — Telephone Encounter (Signed)
Left message to return call to our office.  

## 2020-06-18 NOTE — Telephone Encounter (Signed)
Left message to return call to our office.  

## 2020-06-19 NOTE — Telephone Encounter (Signed)
Have not been able to reach patient ok to mail letter?

## 2020-06-20 NOTE — Telephone Encounter (Signed)
Patient communicates well on mY chart, have you tried reaching him that way?

## 2020-06-26 NOTE — Telephone Encounter (Signed)
Have sent my chart message  

## 2020-07-11 LAB — HM DIABETES EYE EXAM

## 2020-07-21 ENCOUNTER — Other Ambulatory Visit: Payer: Self-pay | Admitting: Primary Care

## 2020-07-21 DIAGNOSIS — E119 Type 2 diabetes mellitus without complications: Secondary | ICD-10-CM

## 2020-07-21 DIAGNOSIS — Z794 Long term (current) use of insulin: Secondary | ICD-10-CM

## 2020-07-30 ENCOUNTER — Other Ambulatory Visit: Payer: Self-pay | Admitting: Primary Care

## 2020-07-30 DIAGNOSIS — F419 Anxiety disorder, unspecified: Secondary | ICD-10-CM

## 2020-07-30 DIAGNOSIS — F32A Depression, unspecified: Secondary | ICD-10-CM

## 2020-07-31 NOTE — Telephone Encounter (Signed)
How's he doing on trazodone for sleep? Does he want refills? If so then okay to send as pended.

## 2020-08-01 NOTE — Telephone Encounter (Signed)
Called patient has been taking 1/2 pill with melatonin and has been working Artist. He is getting a good 6 hrs of uninterrupted sleep at night.

## 2020-08-07 ENCOUNTER — Other Ambulatory Visit: Payer: Self-pay | Admitting: Primary Care

## 2020-08-07 DIAGNOSIS — E119 Type 2 diabetes mellitus without complications: Secondary | ICD-10-CM

## 2020-08-08 ENCOUNTER — Encounter: Payer: Self-pay | Admitting: Primary Care

## 2020-08-12 ENCOUNTER — Telehealth: Payer: Self-pay | Admitting: Primary Care

## 2020-08-12 NOTE — Telephone Encounter (Signed)
LVM for pt to rtn my call to schedule AWV with [facility].  

## 2020-08-20 ENCOUNTER — Other Ambulatory Visit: Payer: Self-pay

## 2020-08-20 ENCOUNTER — Other Ambulatory Visit (INDEPENDENT_AMBULATORY_CARE_PROVIDER_SITE_OTHER): Payer: Medicare HMO

## 2020-08-20 DIAGNOSIS — E119 Type 2 diabetes mellitus without complications: Secondary | ICD-10-CM | POA: Diagnosis not present

## 2020-08-20 LAB — POCT GLYCOSYLATED HEMOGLOBIN (HGB A1C): Hemoglobin A1C: 7.8 % — AB (ref 4.0–5.6)

## 2020-09-03 ENCOUNTER — Other Ambulatory Visit: Payer: Self-pay

## 2020-09-03 ENCOUNTER — Ambulatory Visit (INDEPENDENT_AMBULATORY_CARE_PROVIDER_SITE_OTHER): Payer: Medicare HMO | Admitting: Primary Care

## 2020-09-03 ENCOUNTER — Encounter: Payer: Self-pay | Admitting: Primary Care

## 2020-09-03 DIAGNOSIS — M79606 Pain in leg, unspecified: Secondary | ICD-10-CM | POA: Insufficient documentation

## 2020-09-03 DIAGNOSIS — M79605 Pain in left leg: Secondary | ICD-10-CM | POA: Diagnosis not present

## 2020-09-03 MED ORDER — PREDNISONE 20 MG PO TABS: ORAL_TABLET | ORAL | 0 refills | Status: DC

## 2020-09-03 MED ORDER — KETOROLAC TROMETHAMINE 60 MG/2ML IM SOLN
60.0000 mg | Freq: Once | INTRAMUSCULAR | Status: AC
  Administered 2020-09-03: 60 mg via INTRAMUSCULAR

## 2020-09-03 NOTE — Patient Instructions (Signed)
Start prednisone 20 mg tablets. Take 2 tablets by mouth once daily for 5 days.   Continue to ease into walking.  Avoid strenuous activity.  Use the Voltaren Gel (diclofenac) to the knee 3 times daily.   It was a pleasure to see you today!

## 2020-09-03 NOTE — Addendum Note (Signed)
Addended by: Charlyne Mom D on: 09/03/2020 04:00 PM   Modules accepted: Orders

## 2020-09-03 NOTE — Progress Notes (Signed)
Subjective:    Patient ID: Scott Rice, male    DOB: 01-07-1966, 55 y.o.   MRN: [id number]  HPI  Scott Rice is a very pleasant 55 y.o. male with a history of type 2 diabetes, diabetic neuropathy, hypertension, hyperlipidemia who presents today with a chief complaint of back pain.  His pain began to his anterior and lateral side of his knee about one week ago. Since then the pain has progressed and is now shooting up to his left lower back and down to left lateral calf. He is managed on gabapentin 300 mg HS for chronic neuropathy, feels well managed.   He's tried taking naproxen, using ice/heat, walking.   Prior to his symptom onset he was trying to break in" a new pair of diabetic shoes.   He denies knee swelling, trauma/injury, increased numbness, calf pain, calf redness.    Review of Systems  Musculoskeletal:  Positive for arthralgias and back pain.  Neurological:  Positive for numbness.        Past Medical History:  Diagnosis Date   Anxiety and depression    Diabetic neuropathy (HCC)    Essential hypertension    Hyperlipidemia    Type 2 diabetes mellitus (HCC)     Social History   Socioeconomic History   Marital status: Married    Spouse name: Not on file   Number of children: Not on file   Years of education: Not on file   Highest education level: Not on file  Occupational History   Not on file  Tobacco Use   Smoking status: Former    Pack years: 0.00   Smokeless tobacco: Never   Tobacco comments:    quit 40 years ago  Substance and Sexual Activity   Alcohol use: No   Drug use: No   Sexual activity: Not on file  Other Topics Concern   Not on file  Social History Narrative   Married.   No children.   Works as an Event organiser.   Enjoys watching movies, spending time with family.    Social Determinants of Health   Financial Resource Strain: Not on file  Food Insecurity: Not on file  Transportation Needs: Not on file  Physical  Activity: Not on file  Stress: Not on file  Social Connections: Not on file  Intimate Partner Violence: Not on file    No past surgical history on file.  No family history on file.  No Known Allergies  Current Outpatient Medications on File Prior to Visit  Medication Sig Dispense Refill   atorvastatin (LIPITOR) 20 MG tablet TAKE 1 TABLET BY MOUTH EVERY DAY IN THE EVENING for cholesterol. 90 tablet 3   B Complex-C (SUPER B COMPLEX PO) Take by mouth 2 (two) times daily.     Calcium Carbonate-Vitamin D (CALCIUM 600/VITAMIN D PO) Take by mouth daily.     famotidine (PEPCID) 20 MG tablet Take 20 mg by mouth daily.     FLUoxetine (PROZAC) 40 MG capsule TAKE 1 CAPSULE BY MOUTH EVERY DAY for anxiety. 90 capsule 3   gabapentin (NEURONTIN) 300 MG capsule Take 1 capsule (300 mg total) by mouth at bedtime. For nerve pain. 90 capsule 3   insulin isophane & regular human (NOVOLIN 70/30 FLEXPEN) (70-30) 100 UNIT/ML KwikPen Inject 30 units in the morning and 35 units at bedtime for diabetes 30 mL 5   Insulin Syringe-Needle U-100 (INSULIN SYRINGE 1CC/30GX5/16") 30G X 5/16" 1 ML MISC Inject twice daily with insulin. 200 each  3   JARDIANCE 25 MG TABS tablet Take 1 tablet (25 mg total) by mouth daily. For diabetes. 90 tablet 3   lisinopril (ZESTRIL) 10 MG tablet Take 1 tablet (10 mg total) by mouth daily. For blood pressure. 90 tablet 3   metFORMIN (GLUCOPHAGE) 1000 MG tablet Take 1 tablet (1,000 mg total) by mouth 2 (two) times daily with a meal. For diabetes. 180 tablet 3   traZODone (DESYREL) 50 MG tablet TAKE 1/2 TO 1 TABLET  BY MOUTH AT BEDTIME AS NEEDED FOR SLEEP. 90 tablet 0   No current facility-administered medications on file prior to visit.    BP 118/72   Pulse 90   Temp 98 F (36.7 C) (Temporal)   Ht 6' (1.829 m)   Wt (!) 307 lb 12.8 oz (139.6 kg)   SpO2 97%   BMI 41.75 kg/m  Objective:   Physical Exam Pulmonary:     Effort: Pulmonary effort is normal.  Musculoskeletal:     Left  knee: No swelling, erythema or bony tenderness. Normal range of motion. No tenderness.     Comments: 5/5 strength to bilateral lower extremities. Negative straight leg raise. Ambulatory in clinic without difficulty.   Skin:    General: Skin is warm and dry.          Assessment & Plan:      This visit occurred during the SARS-CoV-2 public health emergency.  Safety protocols were in place, including screening questions prior to the visit, additional usage of staff PPE, and extensive cleaning of exam room while observing appropriate contact time as indicated for disinfecting solutions.

## 2020-09-03 NOTE — Assessment & Plan Note (Addendum)
Acute for the last week, originating from knee but causing sciatica. No alarm signs.   IM Toradol 60 mg provided today. Rx for prednisone course sent to pharmacy. Discussed to watch blood sugars.   He will update.

## 2020-09-05 ENCOUNTER — Ambulatory Visit: Payer: Medicare HMO | Admitting: Internal Medicine

## 2020-09-09 DIAGNOSIS — E119 Type 2 diabetes mellitus without complications: Secondary | ICD-10-CM

## 2020-09-09 DIAGNOSIS — M25562 Pain in left knee: Secondary | ICD-10-CM

## 2020-09-09 DIAGNOSIS — M79605 Pain in left leg: Secondary | ICD-10-CM

## 2020-09-10 MED ORDER — GLIPIZIDE 10 MG PO TABS: 10.0000 mg | ORAL_TABLET | Freq: Two times a day (BID) | ORAL | 1 refills | Status: DC

## 2020-09-12 ENCOUNTER — Ambulatory Visit (INDEPENDENT_AMBULATORY_CARE_PROVIDER_SITE_OTHER)
Admission: RE | Admit: 2020-09-12 | Discharge: 2020-09-12 | Disposition: A | Discharge status: Home / Self Care | Payer: Medicare HMO | Source: Ambulatory Visit | Attending: Primary Care | Admitting: Primary Care

## 2020-09-12 DIAGNOSIS — M79605 Pain in left leg: Secondary | ICD-10-CM | POA: Diagnosis not present

## 2020-09-12 DIAGNOSIS — M25562 Pain in left knee: Secondary | ICD-10-CM | POA: Diagnosis not present

## 2020-09-23 NOTE — Progress Notes (Addendum)
Tarea Skillman T. Kourtney Montesinos, MD, CAQ Sports Medicine [facility] [address] [place], [address]  Phone: [phone]  FAX: [phone]  Scott Rice - 54 y.o. male  MRN [id number]  Date of Birth: [birth year]  Date: 09/24/2020  PCP: Doreene Nest, NP  Referral: Doreene Nest, NP  Chief Complaint  Patient presents with  . Knee Pain    Left  . Leg Pain    Leg    This visit occurred during the SARS-CoV-2 public health emergency.  Safety protocols were in place, including screening questions prior to the visit, additional usage of staff PPE, and extensive cleaning of exam room while observing appropriate contact time as indicated for disinfecting solutions.   Subjective:   Scott Rice is a 55 y.o. very pleasant male patient with Body mass index is 41.6 kg/m. who presents with the following:  He is here for evaluation of some ongoing left-sided knee pain as well as some lower extremity pain with question back and sciatica.  On further questioning, the predominant area of pain seems to be radicular on the left side.  I did review his plain films myself, and they do show some mild degenerative joint disease.  He does not describe any locking up or functional giving way.  A couple of weeks ago, knee was sore, and then knee was hurting really bad.  Had some back pain with radicular pain down his L foot.   Now, neuropathic pain in the leg is the worst. Feels like worse neuropathy.  At baseline he does have diabetic neuropathy.  He does not think that this feels similar to this.  He also notes some decreased sensation on the left lower extremity.  He denies bowel or bladder incontinence.  No saddle anesthesia.  SL is positive on the L  Lab Results  Component Value Date   HGBA1C 7.8 (A) 08/20/2020    L le decr pinp and soft ? B foot decreased  Now gabapentin 300 mg bid  Review of Systems is noted in the HPI, as appropriate    Objective:   BP 132/72   Pulse 99   Temp (!) 96.6 F (35.9 C) (Temporal)   Ht 6' (1.829 m)   Wt (!) 306 lb 12 oz (139.1 kg)   SpO2 98%   BMI 41.60 kg/m    Range of motion at  the waist: Flexion, extension, lateral bending and rotation: Forward flexion to 50.  Extension is modestly restricted with pain. Lateral bending and rotation are grossly preserved  No echymosis or edema Rises to examination table with mild difficulty Gait: minimally antalgic  Inspection/Deformity: N Paraspinus Tenderness: L4-5, left  B Ankle Dorsiflexion (L5,4): 5/5 B Great Toe Dorsiflexion (L5,4): 5/5 Heel Walk (L5): WNL Toe Walk (S1): WNL Rise/Squat (L4): WNL, mild pain  SENSORY B Lateral lateral lower extremity with decreased sensation on the left.  Pinprick and soft touch Bilateral feet have decreased sensation globally  REFLEXES Knee (L4): 2+ Ankle (S1): 2+  B SLR, seated: Positive B Reverse FABER: neg B Greater Troch: NT B Log Roll: neg B Sciatic Notch: Mildly tender to palpation  Left knee: Full extension and flexion to 125.  Stable to varus and valgus stress.  Minimal effusion.  ACL, PCL, MCL and LCL are all totally intact.  Does have some pain with McMurray's and forced flexion.  Bounce home testing is not painful.  Radiology: DG Knee Complete 4 Views Left  Result Date: 09/14/2020  CLINICAL DATA:  Knee pain and instability. Acute on chronic left knee pain with weakness. EXAM: LEFT KNEE - COMPLETE 4+ VIEW COMPARISON:  None. FINDINGS: Weightbearing AP view both knees with tunnel, lateral, and patellar sunrise views of the left knee. Joint spaces are preserved. There is peripheral spurring most prominently affecting the patellofemoral compartment. Minimal spurring of the tibial spines. No fracture, erosion, or focal bone abnormality. Right knee included for comparison is unremarkable. IMPRESSION: Mild left knee osteoarthritis, most prominently affecting the patellofemoral compartment.  Electronically Signed   By: Narda Rutherford M.D.   On: 09/14/2020 00:01    Assessment and Plan:     ICD-10-CM   1. Lumbar radiculopathy, acute  M54.16     2. Acute pain of left knee  M25.562     3. Pain of left lower extremity  M79.605     4. Diabetic mononeuropathy associated with type 2 diabetes mellitus (HCC)  E11.41 gabapentin (NEURONTIN) 300 MG capsule     Predominant issue is left-sided lumbar radiculopathy with decreased sensation laterally.  Suspect L4-5 versus L5-S1.  Classic radicular symptoms, and I do not think that this is predominantly a knee issue.  His knee might be aggravated, but this would be secondary.  And get a burst the patient with some steroids x14 days as well as give him some gabapentin.  Increase gabapentin to 3 times daily dose.  Meds ordered this encounter  Medications  . gabapentin (NEURONTIN) 300 MG capsule    Sig: Take 1 capsule (300 mg total) by mouth 3 (three) times daily. For nerve pain.    Dispense:  90 capsule    Refill:  3  . predniSONE (DELTASONE) 20 MG tablet    Sig: 2 tabs po for 7 days, then 1 tab po for 7 days    Dispense:  21 tablet    Refill:  0  . cyclobenzaprine (FLEXERIL) 10 MG tablet    Sig: Take 0.5-1 tablets (5-10 mg total) by mouth at bedtime as needed for muscle spasms.    Dispense:  30 tablet    Refill:  1   Medications Discontinued During This Encounter  Medication Reason  . predniSONE (DELTASONE) 20 MG tablet Completed Course  . gabapentin (NEURONTIN) 300 MG capsule    No orders of the defined types were placed in this encounter.   Follow-up: Return in about 1 month (around 10/25/2020).  Signed,  Elpidio Galea. Rossie Bretado, MD   Outpatient Encounter Medications as of 09/24/2020  Medication Sig  . acetaminophen (TYLENOL) 500 MG tablet Take 1,000 mg by mouth in the morning and at bedtime.  Marland Kitchen atorvastatin (LIPITOR) 20 MG tablet TAKE 1 TABLET BY MOUTH EVERY DAY IN THE EVENING for cholesterol.  . B Complex-C (SUPER B  COMPLEX PO) Take by mouth 2 (two) times daily.  . Calcium Carbonate-Vitamin D (CALCIUM 600/VITAMIN D PO) Take by mouth daily.  . cyclobenzaprine (FLEXERIL) 10 MG tablet Take 0.5-1 tablets (5-10 mg total) by mouth at bedtime as needed for muscle spasms.  . Diclofenac Sodium 1 % CREA Apply topically in the morning, at noon, and at bedtime.  . empagliflozin (JARDIANCE) 25 MG TABS tablet Take 25 mg by mouth daily.  . famotidine (PEPCID) 20 MG tablet Take 20 mg by mouth daily.  Marland Kitchen FLUoxetine (PROZAC) 40 MG capsule TAKE 1 CAPSULE BY MOUTH EVERY DAY for anxiety.  Marland Kitchen glipiZIDE (GLUCOTROL) 10 MG tablet Take 1 tablet (10 mg total) by mouth 2 (two) times daily before a meal. For  diabetes.  . insulin isophane & regular human (NOVOLIN 70/30 FLEXPEN) (70-30) 100 UNIT/ML KwikPen Inject 30 units in the morning and 35 units at bedtime for diabetes  . Insulin Syringe-Needle U-100 (INSULIN SYRINGE 1CC/30GX5/16") 30G X 5/16" 1 ML MISC Inject twice daily with insulin.  Marland Kitchen lisinopril (ZESTRIL) 10 MG tablet Take 1 tablet (10 mg total) by mouth daily. For blood pressure.  . meloxicam (MOBIC) 7.5 MG tablet Take 7.5 mg by mouth in the morning and at bedtime.  . metFORMIN (GLUCOPHAGE) 1000 MG tablet Take 1 tablet (1,000 mg total) by mouth 2 (two) times daily with a meal. For diabetes.  . naproxen (NAPROSYN) 500 MG tablet Take 500 mg by mouth 2 (two) times daily with a meal.  . predniSONE (DELTASONE) 20 MG tablet 2 tabs po for 7 days, then 1 tab po for 7 days  . traZODone (DESYREL) 50 MG tablet TAKE 1/2 TO 1 TABLET  BY MOUTH AT BEDTIME AS NEEDED FOR SLEEP.  . [DISCONTINUED] gabapentin (NEURONTIN) 300 MG capsule Take 1 capsule (300 mg total) by mouth at bedtime. For nerve pain.  Marland Kitchen gabapentin (NEURONTIN) 300 MG capsule Take 1 capsule (300 mg total) by mouth 3 (three) times daily. For nerve pain.  . [DISCONTINUED] predniSONE (DELTASONE) 20 MG tablet Take 2 tablets by mouth once daily for five days.   No facility-administered  encounter medications on file as of 09/24/2020.

## 2020-09-24 ENCOUNTER — Encounter: Payer: Self-pay | Admitting: Family Medicine

## 2020-09-24 ENCOUNTER — Ambulatory Visit (INDEPENDENT_AMBULATORY_CARE_PROVIDER_SITE_OTHER): Payer: Medicare HMO | Admitting: Family Medicine

## 2020-09-24 ENCOUNTER — Other Ambulatory Visit: Payer: Self-pay

## 2020-09-24 VITALS — BP 132/72 | HR 99 | Temp 96.6°F | Ht 72.0 in | Wt 306.8 lb

## 2020-09-24 DIAGNOSIS — M25562 Pain in left knee: Secondary | ICD-10-CM

## 2020-09-24 DIAGNOSIS — M79605 Pain in left leg: Secondary | ICD-10-CM

## 2020-09-24 DIAGNOSIS — M5416 Radiculopathy, lumbar region: Secondary | ICD-10-CM | POA: Diagnosis not present

## 2020-09-24 DIAGNOSIS — E1141 Type 2 diabetes mellitus with diabetic mononeuropathy: Secondary | ICD-10-CM | POA: Diagnosis not present

## 2020-09-24 MED ORDER — GABAPENTIN 300 MG PO CAPS: 300.0000 mg | ORAL_CAPSULE | Freq: Three times a day (TID) | ORAL | 3 refills | Status: DC

## 2020-09-24 MED ORDER — CYCLOBENZAPRINE HCL 10 MG PO TABS: 5.0000 mg | ORAL_TABLET | Freq: Every evening | ORAL | 1 refills | Status: DC | PRN

## 2020-09-24 MED ORDER — PREDNISONE 20 MG PO TABS: ORAL_TABLET | ORAL | 0 refills | Status: DC

## 2020-10-08 ENCOUNTER — Other Ambulatory Visit: Payer: Self-pay | Admitting: Primary Care

## 2020-10-08 DIAGNOSIS — E119 Type 2 diabetes mellitus without complications: Secondary | ICD-10-CM

## 2020-10-08 DIAGNOSIS — F32A Depression, unspecified: Secondary | ICD-10-CM

## 2020-10-08 DIAGNOSIS — I10 Essential (primary) hypertension: Secondary | ICD-10-CM

## 2020-10-08 DIAGNOSIS — E785 Hyperlipidemia, unspecified: Secondary | ICD-10-CM

## 2020-10-20 ENCOUNTER — Other Ambulatory Visit: Payer: Self-pay | Admitting: Primary Care

## 2020-10-20 DIAGNOSIS — E119 Type 2 diabetes mellitus without complications: Secondary | ICD-10-CM

## 2020-10-24 ENCOUNTER — Other Ambulatory Visit: Payer: Self-pay

## 2020-10-24 DIAGNOSIS — E119 Type 2 diabetes mellitus without complications: Secondary | ICD-10-CM

## 2020-10-24 MED ORDER — NOVOLIN 70/30 (70-30) 100 UNIT/ML ~~LOC~~ SUSP: 35.0000 [IU] | Freq: Two times a day (BID) | SUBCUTANEOUS | 0 refills | Status: DC

## 2020-10-24 NOTE — Telephone Encounter (Signed)
Called patient verified needs Vials called in have updated script with directions indicated by patient of 35 units at breakfast and dinner. 90 day supply called in to mail order as requested. No further action needed at this time.

## 2020-10-27 ENCOUNTER — Ambulatory Visit (INDEPENDENT_AMBULATORY_CARE_PROVIDER_SITE_OTHER): Payer: Medicare HMO | Admitting: Family Medicine

## 2020-10-27 ENCOUNTER — Encounter: Payer: Self-pay | Admitting: Family Medicine

## 2020-10-27 ENCOUNTER — Other Ambulatory Visit: Payer: Self-pay

## 2020-10-27 VITALS — BP 120/70 | HR 100 | Temp 98.2°F | Ht 72.0 in | Wt 311.5 lb

## 2020-10-27 DIAGNOSIS — G8929 Other chronic pain: Secondary | ICD-10-CM | POA: Diagnosis not present

## 2020-10-27 DIAGNOSIS — M549 Dorsalgia, unspecified: Secondary | ICD-10-CM | POA: Diagnosis not present

## 2020-10-27 DIAGNOSIS — M25562 Pain in left knee: Secondary | ICD-10-CM

## 2020-10-27 DIAGNOSIS — Z6841 Body Mass Index (BMI) 40.0 and over, adult: Secondary | ICD-10-CM | POA: Diagnosis not present

## 2020-10-27 NOTE — Progress Notes (Signed)
Scott Gillie T. Rice Lavoy, MD, CAQ Sports Medicine [facility] at [facility] [address] [place], [address]  Phone: [phone]  FAX: [phone]  Osmel Scott Rice - 55 y.o. male  MRN [id number]  Date of Birth: Feb 22, 1966  Date: 10/27/2020  PCP: Doreene Nest, NP  Referral: Doreene Nest, NP  Chief Complaint  Patient presents with  . Follow-up    Lumbar radiculopathy, leg & knee pain    This visit occurred during the SARS-CoV-2 public health emergency.  Safety protocols were in place, including screening questions prior to the visit, additional usage of staff PPE, and extensive cleaning of exam room while observing appropriate contact time as indicated for disinfecting solutions.   Subjective:   Scott Rice is a 55 y.o. very pleasant male patient with Body mass index is 42.25 kg/m. who presents with the following:  F/u L knee and lumbar rad:  I previously saw him 1 month ago.  He is managing everything well from a day to day standpoint.   Pain in his back is doing better, and radicular pain is feeling better but it is not perfect.  Like to do his shoulder stretches in the shower.    Zap in back will be a ongoing for 10-15 seconds. He has often had this a few times a day, but it is not debilitating.   Knee is ok - Had knee surgery in 1990's  Ran over in a pick-up basketball game.   The last time I saw him, I did give him some 14 day course of some prednisone, flexeril, and gabapentin.  09/24/2020 Last OV with Scott Beat, MD  He is here for evaluation of some ongoing left-sided knee pain as well as some lower extremity pain with question back and sciatica.  On further questioning, the predominant area of pain seems to be radicular on the left side.  I did review his plain films myself, and they do show some mild degenerative joint disease.  He does not describe any locking up or functional giving way.  A couple of weeks ago, knee  was sore, and then knee was hurting really bad.  Had some back pain with radicular pain down his L foot.    Now, neuropathic pain in the leg is the worst. Feels like worse neuropathy.  At baseline he does have diabetic neuropathy.  He does not think that this feels similar to this.  He also notes some decreased sensation on the left lower extremity.  He denies bowel or bladder incontinence.  No saddle anesthesia.   SL is positive on the L  Review of Systems is noted in the HPI, as appropriate   Objective:   BP 120/70   Pulse 100   Temp 98.2 F (36.8 C) (Temporal)   Ht 6' (1.829 m)   Wt (!) 311 lb 8 oz (141.3 kg)   SpO2 97%   BMI 42.25 kg/m   GEN: No acute distress; alert,appropriate. PULM: Breathing comfortably in no respiratory distress PSYCH: Normally interactive.    Full ROM at the lumbar spine with no palpable erector spinae pain or central pain at spinous processes.  Neurovascularly intact No provocable sx   Knee:  L Gait: Normal heel toe pattern ROM: 0-130 Effusion: neg Echymosis or edema: none Patellar tendon NT Painful PLICA: neg Patellar grind: negative Medial and lateral patellar facet loading: negative medial and lateral joint lines:NT Mcmurray's neg Flexion-pinch neg Varus and valgus stress: stable Lachman: neg Ant and  Post drawer: neg Hip abduction, IR, ER: WNL Hip flexion str: 5/5 Hip abd: 5/5 Quad: 5/5 VMO atrophy:No Hamstring concentric and eccentric: 5/5   Radiology: CLINICAL DATA:  Knee pain and instability. Acute on chronic left knee pain with weakness.   EXAM: LEFT KNEE - COMPLETE 4+ VIEW   COMPARISON:  None.   FINDINGS: Weightbearing AP view both knees with tunnel, lateral, and patellar sunrise views of the left knee. Joint spaces are preserved. There is peripheral spurring most prominently affecting the patellofemoral compartment. Minimal spurring of the tibial spines. No fracture, erosion, or focal bone abnormality. Right knee  included for comparison is unremarkable.   IMPRESSION: Mild left knee osteoarthritis, most prominently affecting the patellofemoral compartment.     Electronically Signed   By: Scott Rice M.D.   On: 09/14/2020 00:01  Assessment and Plan:     ICD-10-CM   1. Chronic back pain greater than 3 months duration  M54.9 Ambulatory referral to Physical Therapy   G89.29     2. Acute pain of left knee  M25.562 Ambulatory referral to Physical Therapy     Back pain is improved.  I think primary issue for long term is weight control, balance and strength.  I am going to have him go to formal PT on top of what I have previously given him..   Mild OA clinically hx and exam.  Cont with Str, balance, weight loss. Prn nsaids, tylenol. Acute on chronic knee pain with exacerbation.  Social: pain is limiting his ability to exercise maximally  Medications Discontinued During This Encounter  Medication Reason  . insulin isophane & regular human (NOVOLIN 70/30 FLEXPEN) (70-30) 100 UNIT/ML KwikPen Completed Course  . predniSONE (DELTASONE) 20 MG tablet Completed Course  . empagliflozin (JARDIANCE) 25 MG TABS tablet Completed Course   Orders Placed This Encounter  Procedures  . Ambulatory referral to Physical Therapy    Follow-up: No follow-ups on file.  Dragon [employer] One speech-to-text software was used for transcription in this dictation.  Possible transcriptional errors can occur using Animal nutritionist.   Signed,  Scott Rice. Scott Lazenby, MD   Outpatient Encounter Medications as of 10/27/2020  Medication Sig  . acetaminophen (TYLENOL) 500 MG tablet Take 1,000 mg by mouth in the morning and at bedtime.  Marland Kitchen atorvastatin (LIPITOR) 20 MG tablet TAKE 1 TABLET EVERY EVENING FOR CHOLESTEROL  . B Complex-C (SUPER B COMPLEX PO) Take by mouth 2 (two) times daily.  . Calcium Carbonate-Vitamin D (CALCIUM 600/VITAMIN D PO) Take by mouth daily.  . cyclobenzaprine (FLEXERIL) 10 MG tablet Take 0.5-1  tablets (5-10 mg total) by mouth at bedtime as needed for muscle spasms.  . Diclofenac Sodium 1 % CREA Apply topically in the morning, at noon, and at bedtime.  . famotidine (PEPCID) 20 MG tablet Take 20 mg by mouth daily.  Marland Kitchen FLUoxetine (PROZAC) 40 MG capsule TAKE 1 CAPSULE BY MOUTH EVERY DAY FOR ANXIETY.  Marland Kitchen gabapentin (NEURONTIN) 300 MG capsule Take 1 capsule (300 mg total) by mouth 3 (three) times daily. For nerve pain.  Marland Kitchen glipiZIDE (GLUCOTROL) 10 MG tablet Take 1 tablet (10 mg total) by mouth 2 (two) times daily before a meal. For diabetes.  . insulin NPH-regular Human (NOVOLIN 70/30) (70-30) 100 UNIT/ML injection Inject 35 Units into the skin 2 (two) times daily with a meal. 35 units into the skin at breakfast and Dinner for Blood sugar.  . Insulin Syringe-Needle U-100 (INSULIN SYRINGE 1CC/30GX5/16") 30G X 5/16" 1 ML MISC Inject twice  daily with insulin.  Marland Kitchen lisinopril (ZESTRIL) 10 MG tablet TAKE 1 TABLET EVERY DAY FOR BLOOD PRESSURE  . meloxicam (MOBIC) 7.5 MG tablet Take 7.5 mg by mouth in the morning and at bedtime.  . metFORMIN (GLUCOPHAGE) 1000 MG tablet Take 1 tablet (1,000 mg total) by mouth 2 (two) times daily with a meal. For diabetes.  . naproxen (NAPROSYN) 500 MG tablet Take 500 mg by mouth 2 (two) times daily with a meal.  . traZODone (DESYREL) 50 MG tablet TAKE 1/2 TO 1 TABLET  BY MOUTH AT BEDTIME AS NEEDED FOR SLEEP.  . [DISCONTINUED] empagliflozin (JARDIANCE) 25 MG TABS tablet Take 25 mg by mouth daily.  . [DISCONTINUED] insulin isophane & regular human (NOVOLIN 70/30 FLEXPEN) (70-30) 100 UNIT/ML KwikPen Inject 30 units in the morning and 35 units at bedtime for diabetes  . [DISCONTINUED] predniSONE (DELTASONE) 20 MG tablet 2 tabs po for 7 days, then 1 tab po for 7 days   No facility-administered encounter medications on file as of 10/27/2020.

## 2020-10-30 ENCOUNTER — Telehealth: Payer: Self-pay | Admitting: Primary Care

## 2020-10-30 NOTE — Telephone Encounter (Signed)
Mr. Scott Rice called in wanted to let Dr. Patsy Lager know where to send the referral for PT and he already has an appointment set up. [facility] going [facility] in [place] , the fax number [phone].

## 2020-10-31 NOTE — Telephone Encounter (Signed)
Noted on referral.

## 2020-11-10 NOTE — Telephone Encounter (Signed)
Have sent in message to [facility] in patient's chart. No action needed on this message.

## 2020-11-14 DIAGNOSIS — M25562 Pain in left knee: Secondary | ICD-10-CM | POA: Diagnosis not present

## 2020-11-14 DIAGNOSIS — M549 Dorsalgia, unspecified: Secondary | ICD-10-CM | POA: Diagnosis not present

## 2020-11-18 ENCOUNTER — Telehealth: Payer: Self-pay

## 2020-11-18 NOTE — Telephone Encounter (Signed)
Called patient on both numbers but was not able to leave a message. Patient needs Medicare Wellness Visit scheduled. CPE was already done in March 2022

## 2020-11-25 ENCOUNTER — Ambulatory Visit: Payer: Medicare HMO | Admitting: Primary Care

## 2021-01-26 ENCOUNTER — Other Ambulatory Visit: Payer: Self-pay | Admitting: Primary Care

## 2021-01-26 DIAGNOSIS — E1140 Type 2 diabetes mellitus with diabetic neuropathy, unspecified: Secondary | ICD-10-CM

## 2021-01-26 DIAGNOSIS — Z794 Long term (current) use of insulin: Secondary | ICD-10-CM

## 2021-02-24 ENCOUNTER — Other Ambulatory Visit: Payer: Self-pay

## 2021-02-24 ENCOUNTER — Encounter: Payer: Self-pay | Admitting: Primary Care

## 2021-02-24 ENCOUNTER — Ambulatory Visit (INDEPENDENT_AMBULATORY_CARE_PROVIDER_SITE_OTHER): Payer: Medicare HMO | Admitting: Primary Care

## 2021-02-24 ENCOUNTER — Ambulatory Visit (INDEPENDENT_AMBULATORY_CARE_PROVIDER_SITE_OTHER)
Admission: RE | Admit: 2021-02-24 | Discharge: 2021-02-24 | Disposition: A | Discharge status: Home / Self Care | Payer: Medicare HMO | Source: Ambulatory Visit | Attending: Primary Care | Admitting: Primary Care

## 2021-02-24 VITALS — BP 118/62 | HR 92 | Temp 96.5°F | Ht 72.0 in | Wt 316.0 lb

## 2021-02-24 DIAGNOSIS — Z23 Encounter for immunization: Secondary | ICD-10-CM | POA: Diagnosis not present

## 2021-02-24 DIAGNOSIS — R222 Localized swelling, mass and lump, trunk: Secondary | ICD-10-CM | POA: Diagnosis not present

## 2021-02-24 DIAGNOSIS — E1140 Type 2 diabetes mellitus with diabetic neuropathy, unspecified: Secondary | ICD-10-CM | POA: Diagnosis not present

## 2021-02-24 DIAGNOSIS — L03221 Cellulitis of neck: Secondary | ICD-10-CM

## 2021-02-24 DIAGNOSIS — L0390 Cellulitis, unspecified: Secondary | ICD-10-CM | POA: Insufficient documentation

## 2021-02-24 DIAGNOSIS — Z794 Long term (current) use of insulin: Secondary | ICD-10-CM

## 2021-02-24 DIAGNOSIS — E119 Type 2 diabetes mellitus without complications: Secondary | ICD-10-CM

## 2021-02-24 DIAGNOSIS — M549 Dorsalgia, unspecified: Secondary | ICD-10-CM

## 2021-02-24 DIAGNOSIS — G8929 Other chronic pain: Secondary | ICD-10-CM

## 2021-02-24 HISTORY — DX: Cellulitis, unspecified: L03.90

## 2021-02-24 LAB — POCT GLYCOSYLATED HEMOGLOBIN (HGB A1C): Hemoglobin A1C: 6.9 % — AB (ref 4.0–5.6)

## 2021-02-24 MED ORDER — NOVOLIN 70/30 (70-30) 100 UNIT/ML ~~LOC~~ SUSP: SUBCUTANEOUS | 0 refills | Status: DC

## 2021-02-24 MED ORDER — MELOXICAM 7.5 MG PO TABS: 7.5000 mg | ORAL_TABLET | Freq: Two times a day (BID) | ORAL | 0 refills | Status: DC

## 2021-02-24 MED ORDER — GLIPIZIDE 10 MG PO TABS: 10.0000 mg | ORAL_TABLET | Freq: Two times a day (BID) | ORAL | 1 refills | Status: DC

## 2021-02-24 MED ORDER — DOXYCYCLINE HYCLATE 100 MG PO TABS: 100.0000 mg | ORAL_TABLET | Freq: Two times a day (BID) | ORAL | 0 refills | Status: DC

## 2021-02-24 NOTE — Assessment & Plan Note (Signed)
Obvious on exam today.  Question infected cyst?  Regardless, will treat with Doxycycline 100 mg BID x 10 days.  We will plan to see him back in 1 week for follow up. Checking plain films today to rule out bone involvement given proximity to neck.

## 2021-02-24 NOTE — Patient Instructions (Signed)
Start Doxycycline antibiotic for the infection. Take 1 tablet by mouth twice daily for 10 days.  Complete xray(s) prior to leaving today. I will notify you of your results once received.  Schedule a follow up visit with me for one week to check on your neck.  Schedule your physical for March 2023.  It was a pleasure to see you today!

## 2021-02-24 NOTE — Progress Notes (Signed)
Subjective:    Patient ID: Scott Rice, male    DOB: 02-15-1966, 55 y.o.   MRN: [id number]  HPI  Scott Nearingrnest Bivins is a very pleasant 55 y.o. male with a history of type 2 diabetes, hypertension, diabetic neuropathy, hyperlipidemia who presents today for follow up of diabetes and neck skin mass.  He is also requesting a refill of his Meloxicam.   1) Type 2 diabetes:   Current medications include: Glipizide 10 mg BID, metformin 1000 mg BID, Novolin 70/30 35 units BID.   He is injecting 40 units in the AM and 35 units in the PM.   He is checking his blood glucose 1-2 times daily and is getting readings of: 90's to 130's.   Last A1C: 7.8 in June 2022, 6.9 today  Last Eye Exam: UTD Last Foot Exam: Due Pneumonia Vaccination: 2018 Urine Microalbumin: None. Managed on ACE-I Statin: atorvastatin   Dietary changes since last visit: None. Is no longer on steroids for his chronic back pain.    Exercise: None.   2) Skin Mass: Acute for the last 2 weeks after a few plastic ornaments fell onto his neck. The spot is growing, painful, with redness and warmth. He denies blunt injury/trauma.   He denies fevers and feels well overall. He does have pain with neck extension and flexion.   BP Readings from Last 3 Encounters:  02/24/21 118/62  10/27/20 120/70  09/24/20 132/72       Review of Systems  Eyes:  Negative for visual disturbance.  Cardiovascular:  Negative for chest pain.  Skin:  Positive for color change and wound.  Neurological:  Negative for dizziness and numbness.        Past Medical History:  Diagnosis Date   Anxiety and depression    Diabetic neuropathy (HCC)    Essential hypertension    Hyperlipidemia    Type 2 diabetes mellitus (HCC)     Social History   Socioeconomic History   Marital status: Married    Spouse name: Not on file   Number of children: Not on file   Years of education: Not on file   Highest education level: Not on file  Occupational  History   Not on file  Tobacco Use   Smoking status: Former   Smokeless tobacco: Never   Tobacco comments:    quit 40 years ago  Substance and Sexual Activity   Alcohol use: No   Drug use: No   Sexual activity: Not on file  Other Topics Concern   Not on file  Social History Narrative   Married.   No children.   Works as an Event organiserindependent contractor.   Enjoys watching movies, spending time with family.    Social Determinants of Health   Financial Resource Strain: Not on file  Food Insecurity: Not on file  Transportation Needs: Not on file  Physical Activity: Not on file  Stress: Not on file  Social Connections: Not on file  Intimate Partner Violence: Not on file    History reviewed. No pertinent surgical history.  History reviewed. No pertinent family history.  No Known Allergies  Current Outpatient Medications on File Prior to Visit  Medication Sig Dispense Refill   acetaminophen (TYLENOL) 500 MG tablet Take 1,000 mg by mouth in the morning and at bedtime.     atorvastatin (LIPITOR) 20 MG tablet TAKE 1 TABLET EVERY EVENING FOR CHOLESTEROL 90 tablet 2   B Complex-C (SUPER B COMPLEX PO) Take by mouth 2 (two)  times daily.     Calcium Carbonate-Vitamin D (CALCIUM 600/VITAMIN D PO) Take by mouth daily.     cyclobenzaprine (FLEXERIL) 10 MG tablet Take 0.5-1 tablets (5-10 mg total) by mouth at bedtime as needed for muscle spasms. 30 tablet 1   Diclofenac Sodium 1 % CREA Apply topically in the morning, at noon, and at bedtime.     famotidine (PEPCID) 20 MG tablet Take 20 mg by mouth daily.     FLUoxetine (PROZAC) 40 MG capsule TAKE 1 CAPSULE BY MOUTH EVERY DAY FOR ANXIETY. 90 capsule 2   gabapentin (NEURONTIN) 300 MG capsule Take 1 capsule (300 mg total) by mouth 3 (three) times daily. For nerve pain. 90 capsule 3   glipiZIDE (GLUCOTROL) 10 MG tablet Take 1 tablet (10 mg total) by mouth 2 (two) times daily before a meal. For diabetes. 180 tablet 1   Insulin Syringe-Needle U-100  (INSULIN SYRINGE 1CC/30GX5/16") 30G X 5/16" 1 ML MISC Inject twice daily with insulin. 200 each 3   lisinopril (ZESTRIL) 10 MG tablet TAKE 1 TABLET EVERY DAY FOR BLOOD PRESSURE 90 tablet 2   meloxicam (MOBIC) 7.5 MG tablet Take 7.5 mg by mouth in the morning and at bedtime.     metFORMIN (GLUCOPHAGE) 1000 MG tablet Take 1 tablet (1,000 mg total) by mouth 2 (two) times daily with a meal. For diabetes. 180 tablet 3   naproxen (NAPROSYN) 500 MG tablet Take 500 mg by mouth 2 (two) times daily with a meal.     NOVOLIN 70/30 (70-30) 100 UNIT/ML injection INJECT 35 UNITS UNDER THE SKIN TWICE DAILY WITH MEALS (AT BREAKFAST AND DINNER) FOR BLOOD SUGAR 60 mL 0   traZODone (DESYREL) 50 MG tablet TAKE 1/2 TO 1 TABLET  BY MOUTH AT BEDTIME AS NEEDED FOR SLEEP. 90 tablet 0   No current facility-administered medications on file prior to visit.    BP 118/62    Pulse 92    Temp (!) 96.5 F (35.8 C) (Temporal)    Ht 6' (1.829 m)    Wt (!) 316 lb (143.3 kg)    SpO2 96%    BMI 42.86 kg/m  Objective:   Physical Exam Cardiovascular:     Rate and Rhythm: Normal rate and regular rhythm.  Pulmonary:     Effort: Pulmonary effort is normal.     Breath sounds: Normal breath sounds. No wheezing or rales.  Musculoskeletal:     Cervical back: Neck supple.  Skin:    General: Skin is warm and dry.     Findings: Erythema present.     Comments: Elongated, jagged, thickened toenails bilaterally. Slight skin breakdown in between 4th and 5th toes on left foot. No ulceration.  2X5 cm skin mass with erythema and warmth to posterior lower neck. Mild tenderness. No open wounds.     Neurological:     Mental Status: He is alert and oriented to person, place, and time.          Assessment & Plan:      This visit occurred during the SARS-CoV-2 public health emergency.  Safety protocols were in place, including screening questions prior to the visit, additional usage of staff PPE, and extensive cleaning of exam room  while observing appropriate contact time as indicated for disinfecting solutions.

## 2021-02-24 NOTE — Assessment & Plan Note (Signed)
Improved with A1C of 6.9!  Fortunately he is no longer on steroids for chronic back pain.   Encouraged to work on his diet, start some exercise. Also recommended podiatry for routine foot care, he kindly declines and will update when ready.  Continue metformin 1000 mg BID, glipizide 10 mg BID, Novolin 70/30 40 units in AM and 35 units in PM.   Foot exam today. Managed on ACE-I. Managed on statin.   Follow up in March 2023.

## 2021-02-27 DIAGNOSIS — R229 Localized swelling, mass and lump, unspecified: Secondary | ICD-10-CM

## 2021-02-27 DIAGNOSIS — L03221 Cellulitis of neck: Secondary | ICD-10-CM

## 2021-03-02 ENCOUNTER — Telehealth: Payer: Self-pay | Admitting: Primary Care

## 2021-03-02 ENCOUNTER — Other Ambulatory Visit: Payer: Self-pay

## 2021-03-02 ENCOUNTER — Encounter: Payer: Self-pay | Admitting: *Deleted

## 2021-03-02 ENCOUNTER — Ambulatory Visit (INDEPENDENT_AMBULATORY_CARE_PROVIDER_SITE_OTHER): Payer: Medicare HMO | Admitting: Nurse Practitioner

## 2021-03-02 ENCOUNTER — Ambulatory Visit: Payer: Medicare HMO

## 2021-03-02 VITALS — BP 120/64 | HR 96 | Temp 97.6°F | Ht 72.0 in | Wt 316.0 lb

## 2021-03-02 DIAGNOSIS — L0211 Cutaneous abscess of neck: Secondary | ICD-10-CM

## 2021-03-02 DIAGNOSIS — L03221 Cellulitis of neck: Secondary | ICD-10-CM | POA: Diagnosis not present

## 2021-03-02 HISTORY — DX: Cutaneous abscess of neck: L02.11

## 2021-03-02 LAB — CBC WITH DIFFERENTIAL/PLATELET
Basophils Absolute: 0 10*3/uL (ref 0.0–0.1)
Basophils Relative: 0.5 % (ref 0.0–3.0)
Eosinophils Absolute: 0.5 10*3/uL (ref 0.0–0.7)
Eosinophils Relative: 5 % (ref 0.0–5.0)
HCT: 39.2 % (ref 39.0–52.0)
Hemoglobin: 13.2 g/dL (ref 13.0–17.0)
Lymphocytes Relative: 24.3 % (ref 12.0–46.0)
Lymphs Abs: 2.3 10*3/uL (ref 0.7–4.0)
MCHC: 33.5 g/dL (ref 30.0–36.0)
MCV: 84.6 fl (ref 78.0–100.0)
Monocytes Absolute: 0.8 10*3/uL (ref 0.1–1.0)
Monocytes Relative: 8.3 % (ref 3.0–12.0)
Neutro Abs: 5.9 10*3/uL (ref 1.4–7.7)
Neutrophils Relative %: 61.9 % (ref 43.0–77.0)
Platelets: 280 10*3/uL (ref 150.0–400.0)
RBC: 4.64 Mil/uL (ref 4.22–5.81)
RDW: 14.3 % (ref 11.5–15.5)
WBC: 9.6 10*3/uL (ref 4.0–10.5)

## 2021-03-02 MED ORDER — CEFTRIAXONE SODIUM 1 G IJ SOLR
1.0000 g | Freq: Once | INTRAMUSCULAR | Status: AC
  Administered 2021-03-02: 13:00:00 1 g via INTRAMUSCULAR

## 2021-03-02 MED ORDER — CLINDAMYCIN HCL 300 MG PO CAPS: 300.0000 mg | ORAL_CAPSULE | Freq: Four times a day (QID) | ORAL | 0 refills | Status: AC

## 2021-03-02 NOTE — Addendum Note (Signed)
Addended by: Eden Emms on: 03/02/2021 04:40 PM   Modules accepted: Orders

## 2021-03-02 NOTE — Telephone Encounter (Signed)
Pt called stating [facility] office would be calling him back this afternoon. He is on the way to an appt at the [facility]. He asked that if we need to call to call his wife's cell # [phone].

## 2021-03-02 NOTE — Telephone Encounter (Signed)
° °  Pt is being seen at [facility] ENT in [facility] 03/02/21  Everything has been faxed to requested number. [phone]  Pt is aware. Nothing further needed.

## 2021-03-02 NOTE — Assessment & Plan Note (Signed)
Patient has been on doxycycline for a week without improvement.  Patient is not having systemic symptoms at this current moment.  We will switch antibiotic to clindamycin for better anaerobic coverage discussed this with pharmacist at [facility]  Gave strict signs and symptoms when to seek emergent health care.  We will also refer urgently to ENT for further evaluation.

## 2021-03-02 NOTE — Telephone Encounter (Signed)
Pt is scheduled for [place]   [facility] 03/03/21 @ 8:00, arrive 7:45AM

## 2021-03-02 NOTE — Progress Notes (Signed)
Acute Office Visit  Subjective:    Patient ID: Scott Rice, male    DOB: [birth year], 55 y.o.   MRN: [id number]  Chief Complaint  Patient presents with   Cellulitis    Still on doxy no missed doses. Area has became more tender and feels like getting larger. No discharge.     HPI Patient is in today for  Seen 02/24/2021 by Mayra Reel and was placed on doxycycline. Has been taking the doxycyline as prescribed without missing a dose, per patient report.  States that he was hit on the back with some pots and noticed a bump that came up after that and then has progressed.   States that ice and warm compress helps. Feels like it has gotten larger and denser, per his report. Unsure if tenderness is worse. Not leaking fluid. No fever and chills. Did states some discomfort with movement of his neck   Past Medical History:  Diagnosis Date   Anxiety and depression    Diabetic neuropathy (HCC)    Essential hypertension    Hyperlipidemia    Type 2 diabetes mellitus (HCC)     History reviewed. No pertinent surgical history.  History reviewed. No pertinent family history.  Social History   Socioeconomic History   Marital status: Married    Spouse name: Not on file   Number of children: Not on file   Years of education: Not on file   Highest education level: Not on file  Occupational History   Not on file  Tobacco Use   Smoking status: Former   Smokeless tobacco: Never   Tobacco comments:    quit 40 years ago  Substance and Sexual Activity   Alcohol use: No   Drug use: No   Sexual activity: Not on file  Other Topics Concern   Not on file  Social History Narrative   Married.   No children.   Works as an Event organiser.   Enjoys watching movies, spending time with family.    Social Determinants of Health   Financial Resource Strain: Not on file  Food Insecurity: Not on file  Transportation Needs: Not on file  Physical Activity: Not on file  Stress:  Not on file  Social Connections: Not on file  Intimate Partner Violence: Not on file    Outpatient Medications Prior to Visit  Medication Sig Dispense Refill   acetaminophen (TYLENOL) 500 MG tablet Take 1,000 mg by mouth in the morning and at bedtime.     atorvastatin (LIPITOR) 20 MG tablet TAKE 1 TABLET EVERY EVENING FOR CHOLESTEROL 90 tablet 2   B Complex-C (SUPER B COMPLEX PO) Take by mouth 2 (two) times daily.     Calcium Carbonate-Vitamin D (CALCIUM 600/VITAMIN D PO) Take by mouth daily.     cyclobenzaprine (FLEXERIL) 10 MG tablet Take 0.5-1 tablets (5-10 mg total) by mouth at bedtime as needed for muscle spasms. 30 tablet 1   Diclofenac Sodium 1 % CREA Apply topically in the morning, at noon, and at bedtime.     doxycycline (VIBRA-TABS) 100 MG tablet Take 1 tablet (100 mg total) by mouth 2 (two) times daily. 20 tablet 0   famotidine (PEPCID) 20 MG tablet Take 20 mg by mouth daily.     FLUoxetine (PROZAC) 40 MG capsule TAKE 1 CAPSULE BY MOUTH EVERY DAY FOR ANXIETY. 90 capsule 2   gabapentin (NEURONTIN) 300 MG capsule Take 1 capsule (300 mg total) by mouth 3 (three) times daily. For nerve pain.  90 capsule 3   glipiZIDE (GLUCOTROL) 10 MG tablet Take 1 tablet (10 mg total) by mouth 2 (two) times daily before a meal. For diabetes. 180 tablet 1   insulin NPH-regular Human (NOVOLIN 70/30) (70-30) 100 UNIT/ML injection Inject 40 units in the morning and 35 units at night for diabetes. 60 mL 0   Insulin Syringe-Needle U-100 (INSULIN SYRINGE 1CC/30GX5/16") 30G X 5/16" 1 ML MISC Inject twice daily with insulin. 200 each 3   lisinopril (ZESTRIL) 10 MG tablet TAKE 1 TABLET EVERY DAY FOR BLOOD PRESSURE 90 tablet 2   meloxicam (MOBIC) 7.5 MG tablet Take 1 tablet (7.5 mg total) by mouth in the morning and at bedtime. As needed for back pain. 180 tablet 0   metFORMIN (GLUCOPHAGE) 1000 MG tablet Take 1 tablet (1,000 mg total) by mouth 2 (two) times daily with a meal. For diabetes. 180 tablet 3   traZODone  (DESYREL) 50 MG tablet TAKE 1/2 TO 1 TABLET  BY MOUTH AT BEDTIME AS NEEDED FOR SLEEP. 90 tablet 0   No facility-administered medications prior to visit.    No Known Allergies  Review of Systems  Constitutional:  Negative for chills and fever.  HENT:  Negative for trouble swallowing and voice change.   Respiratory:  Negative for shortness of breath.   Cardiovascular:  Negative for chest pain.  Gastrointestinal:  Negative for nausea and vomiting.  Musculoskeletal:  Positive for neck pain.  Skin:  Positive for color change and rash.      Objective:    Physical Exam Vitals and nursing note reviewed.  Constitutional:      Appearance: He is obese.  Cardiovascular:     Rate and Rhythm: Normal rate and regular rhythm.  Pulmonary:     Effort: Pulmonary effort is normal.     Breath sounds: Normal breath sounds.  Abdominal:     General: Bowel sounds are normal.  Lymphadenopathy:     Cervical: No cervical adenopathy.  Skin:    General: Skin is warm.     Findings: Erythema and lesion present.     Comments: 14 cm length 6.5 cm width Erythema, edema and induration   Neurological:     Mental Status: He is alert.     BP 120/64    Pulse 96    Temp 97.6 F (36.4 C) (Temporal)    Ht 6' (1.829 m)    Wt (!) 316 lb (143.3 kg)    SpO2 97%    BMI 42.86 kg/m  Wt Readings from Last 3 Encounters:  03/02/21 (!) 316 lb (143.3 kg)  02/24/21 (!) 316 lb (143.3 kg)  10/27/20 (!) 311 lb 8 oz (141.3 kg)    Health Maintenance Due  Topic Date Due   Pneumococcal Vaccine 54-32 Years old (2 - PCV) 10/28/2017   COLONOSCOPY (Pts 45-4yrs Insurance coverage will need to be confirmed)  02/13/2020    There are no preventive care reminders to display for this patient.   No results found for: TSH Lab Results  Component Value Date   WBC 7.3 05/13/2020   HGB 14.7 05/13/2020   HCT 43.8 05/13/2020   MCV 85.9 05/13/2020   PLT 223.0 05/13/2020   Lab Results  Component Value Date   NA 138 05/13/2020    K 3.9 05/13/2020   CO2 27 05/13/2020   GLUCOSE 104 (H) 05/13/2020   BUN 19 05/13/2020   CREATININE 0.94 05/13/2020   BILITOT 0.5 05/13/2020   ALKPHOS 57 05/13/2020   AST 16  05/13/2020   ALT 22 05/13/2020   PROT 7.1 05/13/2020   ALBUMIN 4.5 05/13/2020   CALCIUM 9.4 05/13/2020   GFR 91.31 05/13/2020   Lab Results  Component Value Date   CHOL 105 05/13/2020   Lab Results  Component Value Date   HDL 42.50 05/13/2020   Lab Results  Component Value Date   LDLCALC 32 05/13/2020   Lab Results  Component Value Date   TRIG 153.0 (H) 05/13/2020   Lab Results  Component Value Date   CHOLHDL 2 05/13/2020   Lab Results  Component Value Date   HGBA1C 6.9 (A) 02/24/2021       Assessment & Plan:   Problem List Items Addressed This Visit       Other   Cellulitis and abscess of neck - Primary    Patient has been on doxycycline for a week without improvement.  Patient is not having systemic symptoms at this current moment.  We will switch antibiotic to clindamycin for better anaerobic coverage discussed this with pharmacist at [facility].  Gave strict signs and symptoms when to seek emergent health care.  We will also refer urgently to ENT for further evaluation.      Relevant Medications   clindamycin (CLEOCIN) 300 MG capsule   Other Relevant Orders   CBC with Differential/Platelet   Ambulatory referral to ENT     No orders of the defined types were placed in this encounter.  This visit occurred during the SARS-CoV-2 public health emergency.  Safety protocols were in place, including screening questions prior to the visit, additional usage of staff PPE, and extensive cleaning of exam room while observing appropriate contact time as indicated for disinfecting solutions.   Audria Nine, NP

## 2021-03-02 NOTE — Patient Instructions (Signed)
Nice to see you today STOP taking the Doxycycline and START taking the clindamycin in the morning I gave you an injection of antibiotics here If you start having trouble breathing, swallowing, talking, or start running fevers at home go to the ED for further evaluation

## 2021-03-03 ENCOUNTER — Ambulatory Visit: Payer: Medicare HMO

## 2021-03-05 ENCOUNTER — Ambulatory Visit: Payer: Medicare HMO | Admitting: Primary Care

## 2021-03-10 ENCOUNTER — Telehealth: Payer: Self-pay

## 2021-03-10 NOTE — Telephone Encounter (Signed)
Called patient and reviewed last lab result notes from 03/02/21. Patient saw ENT on 03/03/2021. Patient was advised then to continue with antibiotic and he finished that round yesterday 03/09/21, patient has follow up with ENT on 12/28/2. The lump spot is smaller. Patient was told by ENT that it could be a month or so before the area gets normal. Patient was advised to keep [place] posted as needed on this.

## 2021-03-11 DIAGNOSIS — L03221 Cellulitis of neck: Secondary | ICD-10-CM | POA: Diagnosis not present

## 2021-03-27 DIAGNOSIS — L03221 Cellulitis of neck: Secondary | ICD-10-CM | POA: Diagnosis not present

## 2021-04-13 DIAGNOSIS — L03221 Cellulitis of neck: Secondary | ICD-10-CM

## 2021-04-13 DIAGNOSIS — L0211 Cutaneous abscess of neck: Secondary | ICD-10-CM

## 2021-04-13 MED ORDER — MUPIROCIN 2 % EX OINT: 1.0000 "application " | TOPICAL_OINTMENT | Freq: Two times a day (BID) | CUTANEOUS | 0 refills | Status: DC

## 2021-06-02 ENCOUNTER — Encounter: Payer: Medicare HMO | Admitting: Primary Care

## 2021-06-10 ENCOUNTER — Ambulatory Visit (INDEPENDENT_AMBULATORY_CARE_PROVIDER_SITE_OTHER): Payer: Medicare HMO | Admitting: Primary Care

## 2021-06-10 ENCOUNTER — Encounter: Payer: Self-pay | Admitting: Primary Care

## 2021-06-10 ENCOUNTER — Other Ambulatory Visit: Payer: Self-pay

## 2021-06-10 VITALS — BP 116/72 | HR 89 | Temp 97.9°F | Ht 72.0 in | Wt 321.6 lb

## 2021-06-10 DIAGNOSIS — L03221 Cellulitis of neck: Secondary | ICD-10-CM | POA: Diagnosis not present

## 2021-06-10 DIAGNOSIS — Z794 Long term (current) use of insulin: Secondary | ICD-10-CM | POA: Diagnosis not present

## 2021-06-10 DIAGNOSIS — I10 Essential (primary) hypertension: Secondary | ICD-10-CM

## 2021-06-10 DIAGNOSIS — Z125 Encounter for screening for malignant neoplasm of prostate: Secondary | ICD-10-CM | POA: Diagnosis not present

## 2021-06-10 DIAGNOSIS — M545 Low back pain, unspecified: Secondary | ICD-10-CM | POA: Diagnosis not present

## 2021-06-10 DIAGNOSIS — Z0001 Encounter for general adult medical examination with abnormal findings: Secondary | ICD-10-CM

## 2021-06-10 DIAGNOSIS — E785 Hyperlipidemia, unspecified: Secondary | ICD-10-CM | POA: Diagnosis not present

## 2021-06-10 DIAGNOSIS — F419 Anxiety disorder, unspecified: Secondary | ICD-10-CM

## 2021-06-10 DIAGNOSIS — F32A Depression, unspecified: Secondary | ICD-10-CM | POA: Diagnosis not present

## 2021-06-10 DIAGNOSIS — L0211 Cutaneous abscess of neck: Secondary | ICD-10-CM

## 2021-06-10 DIAGNOSIS — Z6841 Body Mass Index (BMI) 40.0 and over, adult: Secondary | ICD-10-CM | POA: Diagnosis not present

## 2021-06-10 DIAGNOSIS — K219 Gastro-esophageal reflux disease without esophagitis: Secondary | ICD-10-CM

## 2021-06-10 DIAGNOSIS — Z23 Encounter for immunization: Secondary | ICD-10-CM | POA: Diagnosis not present

## 2021-06-10 DIAGNOSIS — E1141 Type 2 diabetes mellitus with diabetic mononeuropathy: Secondary | ICD-10-CM | POA: Diagnosis not present

## 2021-06-10 DIAGNOSIS — E1165 Type 2 diabetes mellitus with hyperglycemia: Secondary | ICD-10-CM | POA: Diagnosis not present

## 2021-06-10 DIAGNOSIS — G8929 Other chronic pain: Secondary | ICD-10-CM

## 2021-06-10 LAB — COMPREHENSIVE METABOLIC PANEL
ALT: 21 U/L (ref 0–53)
AST: 22 U/L (ref 0–37)
Albumin: 4.6 g/dL (ref 3.5–5.2)
Alkaline Phosphatase: 60 U/L (ref 39–117)
BUN: 17 mg/dL (ref 6–23)
CO2: 23 mEq/L (ref 19–32)
Calcium: 9.2 mg/dL (ref 8.4–10.5)
Chloride: 104 mEq/L (ref 96–112)
Creatinine, Ser: 1.04 mg/dL (ref 0.40–1.50)
GFR: 80.27 mL/min (ref >60.00)
Glucose, Bld: 159 mg/dL — ABNORMAL HIGH (ref 70–99)
Potassium: 4.6 mEq/L (ref 3.5–5.1)
Sodium: 138 mEq/L (ref 135–145)
Total Bilirubin: 0.7 mg/dL (ref 0.2–1.2)
Total Protein: 7.2 g/dL (ref 6.0–8.3)

## 2021-06-10 LAB — LIPID PANEL
Cholesterol: 108 mg/dL (ref 0–200)
HDL: 38.4 mg/dL — ABNORMAL LOW (ref >39.00)
LDL Cholesterol: 44 mg/dL (ref 0–99)
NonHDL: 69.13
Total CHOL/HDL Ratio: 3
Triglycerides: 128 mg/dL (ref 0.0–149.0)
VLDL: 25.6 mg/dL (ref 0.0–40.0)

## 2021-06-10 LAB — HEMOGLOBIN A1C: Hgb A1c MFr Bld: 7.3 % — ABNORMAL HIGH (ref 4.6–6.5)

## 2021-06-10 LAB — PSA, MEDICARE: PSA: 0.14 ng/ml (ref 0.10–4.00)

## 2021-06-10 MED ORDER — GABAPENTIN 300 MG PO CAPS: 300.0000 mg | ORAL_CAPSULE | Freq: Two times a day (BID) | ORAL | 1 refills | Status: DC

## 2021-06-10 MED ORDER — FLUOXETINE HCL 20 MG PO CAPS: 20.0000 mg | ORAL_CAPSULE | Freq: Every day | ORAL | 1 refills | Status: DC

## 2021-06-10 NOTE — Assessment & Plan Note (Signed)
Shingrix due, provided first dose today.  Other vaccines up-to-date. ? ?PSA due and pending. ?Colonoscopy overdue, he continues to decline despite recommendations.  He also declines Cologuard at this time. ? ?Discussed the importance of a healthy diet and regular exercise in order for weight loss, and to reduce the risk of further co-morbidity. ? ?Exam today stable. ?Labs pending.  ?

## 2021-06-10 NOTE — Addendum Note (Signed)
Addended by: Donney Dice E on: 06/10/2021 10:26 AM ? ? Modules accepted: Orders ? ?

## 2021-06-10 NOTE — Assessment & Plan Note (Signed)
Controlled.  Continue famotidine 20 mg daily.  

## 2021-06-10 NOTE — Assessment & Plan Note (Addendum)
Overall stable. ? ?Continue cyclobenzaprine 5-10 mg HS PRN, meloxicam 7.5 mg PRN. ?Continue gabapentin 300 mg 1-2 times daily. ? ?Following with sports medicine. ?

## 2021-06-10 NOTE — Patient Instructions (Signed)
Stop by the lab prior to leaving today. I will notify you of your results once received.  ? ?We increase the dose of your fluoxetine to 60 mg.  Continue the 40 mg pill, I sent a new prescription for the 20 mg pill to your pharmacy.  Please update me in 1 month if no improvement in your symptoms. ? ?Please schedule a follow up visit for 6 months for a diabetes check and your second Shingrix shot. ? ?It was a pleasure to see you today! ? ?Preventive Care 57-24 Years Old, Male ?Preventive care refers to lifestyle choices and visits with your health care provider that can promote health and wellness. Preventive care visits are also called wellness exams. ?What can I expect for my preventive care visit? ?Counseling ?During your preventive care visit, your health care provider may ask about your: ?Medical history, including: ?Past medical problems. ?Family medical history. ?Current health, including: ?Emotional well-being. ?Home life and relationship well-being. ?Sexual activity. ?Lifestyle, including: ?Alcohol, nicotine or tobacco, and drug use. ?Access to firearms. ?Diet, exercise, and sleep habits. ?Safety issues such as seatbelt and bike helmet use. ?Sunscreen use. ?Work and work Astronomer. ?Physical exam ?Your health care provider will check your: ?Height and weight. These may be used to calculate your BMI (body mass index). BMI is a measurement that tells if you are at a healthy weight. ?Waist circumference. This measures the distance around your waistline. This measurement also tells if you are at a healthy weight and may help predict your risk of certain diseases, such as type 2 diabetes and high blood pressure. ?Heart rate and blood pressure. ?Body temperature. ?Skin for abnormal spots. ?What immunizations do I need? ?Vaccines are usually given at various ages, according to a schedule. Your health care provider will recommend vaccines for you based on your age, medical history, and lifestyle or other factors,  such as travel or where you work. ?What tests do I need? ?Screening ?Your health care provider may recommend screening tests for certain conditions. This may include: ?Lipid and cholesterol levels. ?Diabetes screening. This is done by checking your blood sugar (glucose) after you have not eaten for a while (fasting). ?Hepatitis B test. ?Hepatitis C test. ?HIV (human immunodeficiency virus) test. ?STI (sexually transmitted infection) testing, if you are at risk. ?Lung cancer screening. ?Prostate cancer screening. ?Colorectal cancer screening. ?Talk with your health care provider about your test results, treatment options, and if necessary, the need for more tests. ?Follow these instructions at home: ?Eating and drinking ? ?Eat a diet that includes fresh fruits and vegetables, whole grains, lean protein, and low-fat dairy products. ?Take vitamin and mineral supplements as recommended by your health care provider. ?Do not drink alcohol if your health care provider tells you not to drink. ?If you drink alcohol: ?Limit how much you have to 0-2 drinks a day. ?Know how much alcohol is in your drink. In the [place] one drink equals one 12 oz bottle of beer (355 mL), one 5 oz glass of wine (148 mL), or one 1? oz glass of hard liquor (44 mL). ?Lifestyle ?Brush your teeth every morning and night with fluoride toothpaste. Floss one time each day. ?Exercise for at least 30 minutes 5 or more days each week. ?Do not use any products that contain nicotine or tobacco. These products include cigarettes, chewing tobacco, and vaping devices, such as e-cigarettes. If you need help quitting, ask your health care provider. ?Do not use drugs. ?If you are sexually active, practice safe sex.  Use a condom or other form of protection to prevent STIs. ?Take aspirin only as told by your health care provider. Make sure that you understand how much to take and what form to take. Work with your health care provider to find out whether it is safe and  beneficial for you to take aspirin daily. ?Find healthy ways to manage stress, such as: ?Meditation, yoga, or listening to music. ?Journaling. ?Talking to a trusted person. ?Spending time with friends and family. ?Minimize exposure to UV radiation to reduce your risk of skin cancer. ?Safety ?Always wear your seat belt while driving or riding in a vehicle. ?Do not drive: ?If you have been drinking alcohol. Do not ride with someone who has been drinking. ?When you are tired or distracted. ?While texting. ?If you have been using any mind-altering substances or drugs. ?Wear a helmet and other protective equipment during sports activities. ?If you have firearms in your house, make sure you follow all gun safety procedures. ?What's next? ?Go to your health care provider once a year for an annual wellness visit. ?Ask your health care provider how often you should have your eyes and teeth checked. ?Stay up to date on all vaccines. ?This information is not intended to replace advice given to you by your health care provider. Make sure you discuss any questions you have with your health care provider. ?Document Revised: 08/27/2020 Document Reviewed: 08/27/2020 ?Elsevier Patient Education ? 2022 [employer] Inc. ? ?

## 2021-06-10 NOTE — Assessment & Plan Note (Signed)
Controlled.   Continue lisinopril 10 mg daily.  CMP pending.  

## 2021-06-10 NOTE — Assessment & Plan Note (Signed)
Deteriorated. ? ?Discussed options, will increase dose of fluoxetine. Continue fluoxetine 40 mg, will add fluoxetine 20 mg daily to regimen. ? ?He will update if no improvement.  ?

## 2021-06-10 NOTE — Progress Notes (Signed)
? ?Subjective:  ? ? Patient ID: Scott Rice, male    DOB: 09-Sep-1965, 56 y.o.   MRN: [id number] ? ?HPI ? ?Scott Rice is a very pleasant 56 y.o. male who presents today for complete physical and follow up of chronic conditions. He would also like to mention anxiety and depression.  ? ?Glucose readings are ranging in the low 100's.  ? ?He's been under a lot of stress recently with his wife's recent breast cancer and his fathers poor health. Overall he's done well with fluoxetine 40 mg, but over the last 6 months he's noticed a gradual decline in sleep. He has difficulty falling asleep, experiences mind racing thoughts. He is compliant to fluoxetine 40 mg daily, has not missed doses.  ? ?Immunizations: ?-Tetanus: 2020 ?-Influenza: Completed this season  ?-Covid-19: has not completed ?-Shingles: Never completed, agrees today ?-Pneumonia: Pneumovax 23 in 2018 ? ?Diet: Fair diet.  ?Exercise: No regular exercise. ? ?Eye exam: Completes annually  ?Dental exam: Completes semi-annually  ? ?Colonoscopy: Overdue, declines colonoscopy, never completed Cologuard last year. History of IBS.  ?PSA: Due ? ?BP Readings from Last 3 Encounters:  ?06/10/21 116/72  ?03/02/21 120/64  ?02/24/21 118/62  ? ? ? ? ? ? ?Review of Systems  ?Constitutional:  Negative for unexpected weight change.  ?HENT:  Negative for rhinorrhea.   ?Respiratory:  Negative for cough and shortness of breath.   ?Cardiovascular:  Negative for chest pain.  ?Gastrointestinal:  Negative for constipation and diarrhea.  ?Genitourinary:  Negative for difficulty urinating.  ?Musculoskeletal:  Positive for arthralgias and back pain.  ?Skin:  Negative for rash.  ?Allergic/Immunologic: Negative for environmental allergies.  ?Neurological:  Positive for numbness. Negative for dizziness and headaches.  ?Psychiatric/Behavioral:  The patient is nervous/anxious.   ?     See HPI  ? ?   ? ? ?Past Medical History:  ?Diagnosis Date  ? Anxiety and depression   ? Diabetic  neuropathy (HCC)   ? Essential hypertension   ? Hyperlipidemia   ? Type 2 diabetes mellitus (HCC)   ? ? ?Social History  ? ?Socioeconomic History  ? Marital status: Married  ?  Spouse name: Not on file  ? Number of children: Not on file  ? Years of education: Not on file  ? Highest education level: Not on file  ?Occupational History  ? Not on file  ?Tobacco Use  ? Smoking status: Former  ? Smokeless tobacco: Never  ? Tobacco comments:  ?  quit 40 years ago  ?Substance and Sexual Activity  ? Alcohol use: No  ? Drug use: No  ? Sexual activity: Not on file  ?Other Topics Concern  ? Not on file  ?Social History Narrative  ? Married.  ? No children.  ? Works as an Event organiser.  ? Enjoys watching movies, spending time with family.   ? ?Social Determinants of Health  ? ?Financial Resource Strain: Not on file  ?Food Insecurity: Not on file  ?Transportation Needs: Not on file  ?Physical Activity: Not on file  ?Stress: Not on file  ?Social Connections: Not on file  ?Intimate Partner Violence: Not on file  ? ? ?No past surgical history on file. ? ?No family history on file. ? ?No Known Allergies ? ?Current Outpatient Medications on File Prior to Visit  ?Medication Sig Dispense Refill  ? acetaminophen (TYLENOL) 500 MG tablet Take 1,000 mg by mouth in the morning and at bedtime.    ? atorvastatin (LIPITOR) 20 MG tablet TAKE  1 TABLET EVERY EVENING FOR CHOLESTEROL 90 tablet 2  ? B Complex-C (SUPER B COMPLEX PO) Take by mouth 2 (two) times daily.    ? Calcium Carbonate-Vitamin D (CALCIUM 600/VITAMIN D PO) Take by mouth daily.    ? cyclobenzaprine (FLEXERIL) 10 MG tablet Take 0.5-1 tablets (5-10 mg total) by mouth at bedtime as needed for muscle spasms. 30 tablet 1  ? Diclofenac Sodium 1 % CREA Apply topically in the morning, at noon, and at bedtime.    ? famotidine (PEPCID) 20 MG tablet Take 20 mg by mouth daily.    ? FLUoxetine (PROZAC) 40 MG capsule TAKE 1 CAPSULE BY MOUTH EVERY DAY FOR ANXIETY. 90 capsule 2  ?  gabapentin (NEURONTIN) 300 MG capsule Take 1 capsule (300 mg total) by mouth 3 (three) times daily. For nerve pain. 90 capsule 3  ? glipiZIDE (GLUCOTROL) 10 MG tablet Take 1 tablet (10 mg total) by mouth 2 (two) times daily before a meal. For diabetes. 180 tablet 1  ? insulin NPH-regular Human (NOVOLIN 70/30) (70-30) 100 UNIT/ML injection Inject 40 units in the morning and 35 units at night for diabetes. (Patient taking differently: Inject 35 units in the morning and 40 units at night for diabetes.) 60 mL 0  ? Insulin Syringe-Needle U-100 (INSULIN SYRINGE 1CC/30GX5/16") 30G X 5/16" 1 ML MISC Inject twice daily with insulin. 200 each 3  ? lisinopril (ZESTRIL) 10 MG tablet TAKE 1 TABLET EVERY DAY FOR BLOOD PRESSURE 90 tablet 2  ? meloxicam (MOBIC) 7.5 MG tablet Take 1 tablet (7.5 mg total) by mouth in the morning and at bedtime. As needed for back pain. 180 tablet 0  ? metFORMIN (GLUCOPHAGE) 1000 MG tablet Take 1 tablet (1,000 mg total) by mouth 2 (two) times daily with a meal. For diabetes. 180 tablet 3  ? traZODone (DESYREL) 50 MG tablet TAKE 1/2 TO 1 TABLET  BY MOUTH AT BEDTIME AS NEEDED FOR SLEEP. 90 tablet 0  ? mupirocin ointment (BACTROBAN) 2 % Apply 1 application topically 2 (two) times daily. (Patient not taking: Reported on 06/10/2021) 22 g 0  ? ?No current facility-administered medications on file prior to visit.  ? ? ?BP 116/72 (BP Location: Left Arm, Patient Position: Sitting, Cuff Size: Large)   Pulse 89   Temp 97.9 ?F (36.6 ?C) (Oral)   Ht 6' (1.829 m)   Wt (!) 321 lb 9.6 oz (145.9 kg)   SpO2 97%   BMI 43.62 kg/m?  ?Objective:  ? Physical Exam ?HENT:  ?   Right Ear: Tympanic membrane and ear canal normal.  ?   Left Ear: Tympanic membrane and ear canal normal.  ?   Nose: Nose normal.  ?   Right Sinus: No maxillary sinus tenderness or frontal sinus tenderness.  ?   Left Sinus: No maxillary sinus tenderness or frontal sinus tenderness.  ?Eyes:  ?   Conjunctiva/sclera: Conjunctivae normal.  ?Neck:  ?    Thyroid: No thyromegaly.  ?   Vascular: No carotid bruit.  ?Cardiovascular:  ?   Rate and Rhythm: Normal rate and regular rhythm.  ?   Heart sounds: Normal heart sounds.  ?Pulmonary:  ?   Effort: Pulmonary effort is normal.  ?   Breath sounds: Normal breath sounds. No wheezing or rales.  ?Abdominal:  ?   General: Bowel sounds are normal.  ?   Palpations: Abdomen is soft.  ?   Tenderness: There is no abdominal tenderness.  ?Musculoskeletal:     ?  General: Normal range of motion.  ?   Cervical back: Neck supple.  ?Skin: ?   General: Skin is warm and dry.  ?Neurological:  ?   Mental Status: He is alert and oriented to person, place, and time.  ?   Cranial Nerves: No cranial nerve deficit.  ?   Deep Tendon Reflexes: Reflexes are normal and symmetric.  ?Psychiatric:     ?   Mood and Affect: Mood normal.  ? ? ? ? ? ?   ?Assessment & Plan:  ? ? ? ? ?This visit occurred during the SARS-CoV-2 public health emergency.  Safety protocols were in place, including screening questions prior to the visit, additional usage of staff PPE, and extensive cleaning of exam room while observing appropriate contact time as indicated for disinfecting solutions.  ?

## 2021-06-10 NOTE — Assessment & Plan Note (Signed)
Resolved

## 2021-06-10 NOTE — Assessment & Plan Note (Signed)
Overall controlled. ? ?Continue gabapentin 300 mg once to twice daily. ?Refills provided.  ?

## 2021-06-10 NOTE — Assessment & Plan Note (Addendum)
Repeat A1C pending today. ? ?Continue Glipizide 10 mg BID, metformin 1000 mg BID, NPH 35 units in AM and 40 units in PM.  ? ?Managed on statin and ACE-I. ?Eye and foot exams UTD. ?Pneumonia vaccine UTD.  ? ?Follow up in 6 months.  ?

## 2021-06-10 NOTE — Assessment & Plan Note (Signed)
Repeat lipid panel pending today. ? ?Continue atorvastatin 20 mg daily.  ?

## 2021-07-25 ENCOUNTER — Other Ambulatory Visit: Payer: Self-pay | Admitting: Primary Care

## 2021-07-25 DIAGNOSIS — L0211 Cutaneous abscess of neck: Secondary | ICD-10-CM

## 2021-07-25 DIAGNOSIS — E119 Type 2 diabetes mellitus without complications: Secondary | ICD-10-CM

## 2021-07-25 DIAGNOSIS — E1140 Type 2 diabetes mellitus with diabetic neuropathy, unspecified: Secondary | ICD-10-CM

## 2021-09-10 ENCOUNTER — Telehealth (INDEPENDENT_AMBULATORY_CARE_PROVIDER_SITE_OTHER): Payer: Medicare HMO | Admitting: Family Medicine

## 2021-09-10 DIAGNOSIS — U071 COVID-19: Secondary | ICD-10-CM | POA: Diagnosis not present

## 2021-09-10 MED ORDER — BENZONATATE 100 MG PO CAPS: ORAL_CAPSULE | ORAL | 0 refills | Status: DC

## 2021-09-10 MED ORDER — MOLNUPIRAVIR EUA 200MG CAPSULE: 4.0000 | ORAL_CAPSULE | Freq: Two times a day (BID) | ORAL | 0 refills | Status: AC

## 2021-09-10 NOTE — Progress Notes (Signed)
Virtual Visit via Video Note  I connected with "Scott Rice"  on 09/10/21 at  3:40 PM EDT by a video enabled telemedicine application and verified that I am speaking with the correct person using two identifiers.  Location patient: [place] Location provider:work or home office Persons participating in the virtual visit: patient, provider, patient's wife  I discussed the limitations and requested verbal permission for telemedicine visit. The patient expressed understanding and agreed to proceed.   HPI:  Acute telemedicine visit for Covid19: -Onset: about 3 days ago; tested positive for covid -a family member is taking Legevrio and he is interested in this medication -Symptoms include: no taste, feels tired, headache, nasal congestion, cough -Denies:NVD, CP, worst ha, SOB -able to eat and drink ok -Has tried:flonase, nyquil, OTC sinus and headache - nyquil helped -Pertinent past medical history: see below - has had covid several times in the past; GFR 80 in March -denies sexual activity with anyone who could become pregnant -Pertinent medication allergies:No Known Allergies -COVID-19 vaccine status: not vaccinated Immunization History  Administered Date(s) Administered   Influenza,inj,Quad PF,6+ Mos 05/20/2020, 02/24/2021   Influenza-Unspecified 12/27/2017, 01/02/2019   Pneumococcal Polysaccharide-23 10/28/2016   Tdap 05/16/2018   Zoster Recombinat (Shingrix) 06/10/2021     ROS: See pertinent positives and negatives per HPI.  Past Medical History:  Diagnosis Date   Anxiety and depression    Diabetic neuropathy (HCC)    Essential hypertension    Hyperlipidemia    Type 2 diabetes mellitus (HCC)     No past surgical history on file.   Current Outpatient Medications:    benzonatate (TESSALON PERLES) 100 MG capsule, 1-2 capsules up to twice daily as needed for cough., Disp: 30 capsule, Rfl: 0   molnupiravir EUA (LAGEVRIO) 200 mg CAPS capsule, Take 4 capsules (800 mg total) by mouth 2  (two) times daily for 5 days., Disp: 40 capsule, Rfl: 0   acetaminophen (TYLENOL) 500 MG tablet, Take 1,000 mg by mouth in the morning and at bedtime., Disp: , Rfl:    atorvastatin (LIPITOR) 20 MG tablet, TAKE 1 TABLET EVERY EVENING FOR CHOLESTEROL, Disp: 90 tablet, Rfl: 2   B Complex-C (SUPER B COMPLEX PO), Take by mouth 2 (two) times daily., Disp: , Rfl:    Calcium Carbonate-Vitamin D (CALCIUM 600/VITAMIN D PO), Take by mouth daily., Disp: , Rfl:    cyclobenzaprine (FLEXERIL) 10 MG tablet, Take 0.5-1 tablets (5-10 mg total) by mouth at bedtime as needed for muscle spasms., Disp: 30 tablet, Rfl: 1   Diclofenac Sodium 1 % CREA, Apply topically in the morning, at noon, and at bedtime., Disp: , Rfl:    famotidine (PEPCID) 20 MG tablet, Take 20 mg by mouth daily., Disp: , Rfl:    FLUoxetine (PROZAC) 20 MG capsule, Take 1 capsule (20 mg total) by mouth daily. for anxiety and depression. Take with 40 mg dose., Disp: 90 capsule, Rfl: 1   FLUoxetine (PROZAC) 40 MG capsule, TAKE 1 CAPSULE BY MOUTH EVERY DAY FOR ANXIETY., Disp: 90 capsule, Rfl: 2   gabapentin (NEURONTIN) 300 MG capsule, Take 1 capsule (300 mg total) by mouth 2 (two) times daily. For nerve pain., Disp: 180 capsule, Rfl: 1   glipiZIDE (GLUCOTROL) 10 MG tablet, Take 1 tablet (10 mg total) by mouth 2 (two) times daily before a meal. For diabetes., Disp: 180 tablet, Rfl: 1   insulin NPH-regular Human (NOVOLIN 70/30) (70-30) 100 UNIT/ML injection, INJECT 40 UNITS IN THE MORNING AND 35 UNITS AT NIGHT FOR DIABETES., Disp: 60  mL, Rfl: 1   Insulin Syringe-Needle U-100 (INSULIN SYRINGE 1CC/30GX5/16") 30G X 5/16" 1 ML MISC, Inject twice daily with insulin., Disp: 200 each, Rfl: 3   lisinopril (ZESTRIL) 10 MG tablet, TAKE 1 TABLET EVERY DAY FOR BLOOD PRESSURE, Disp: 90 tablet, Rfl: 2   meloxicam (MOBIC) 7.5 MG tablet, Take 1 tablet (7.5 mg total) by mouth in the morning and at bedtime. As needed for back pain., Disp: 180 tablet, Rfl: 0   metFORMIN  (GLUCOPHAGE) 1000 MG tablet, TAKE 1 TABLET  BY MOUTH 2 (TWO) TIMES DAILY WITH A MEAL FOR DIABETES., Disp: 180 tablet, Rfl: 1   mupirocin ointment (BACTROBAN) 2 %, APPLY 1 APPLICATION TOPICALLY 2 (TWO) TIMES DAILY., Disp: 22 g, Rfl: 0   traZODone (DESYREL) 50 MG tablet, TAKE 1/2 TO 1 TABLET  BY MOUTH AT BEDTIME AS NEEDED FOR SLEEP., Disp: 90 tablet, Rfl: 0  EXAM:  VITALS per patient if applicable:  GENERAL: alert, oriented, appears well and in no acute distress  HEENT: atraumatic, conjunttiva clear, no obvious abnormalities on inspection of external nose and ears  NECK: normal movements of the head and neck  LUNGS: on inspection no signs of respiratory distress, breathing rate appears normal, no obvious gross SOB, gasping or wheezing  CV: no obvious cyanosis  MS: moves all visible extremities without noticeable abnormality  PSYCH/NEURO: pleasant and cooperative, no obvious depression or anxiety, speech and thought processing grossly intact  ASSESSMENT AND PLAN:  Discussed the following assessment and plan:  COVID-19   Discussed treatment options, side effect and risk of drug interactions, ideal treatment window, potential complications, isolation and precautions for COVID-19.  Discussed possibility of rebound with or without antivirals. Checked for/reviewed last GFR - listed in HPI if available.  After lengthy discussion, the patient opted for treatment with legevrio due to being higher risk for complications of covid or severe disease and other factors. Discussed known risks and alternatives - he preferred this over paxlovid due to potential interactions with some of his medications and recs for family member. Discussed pregnancy risks. The patient did want a prescription for cough, Tessalon Rx sent.  Other symptomatic care measures summarized in patient instructions. Advised to seek prompt virtual visit or in person care if worsening, new symptoms arise, or if is not improving with  treatment as expected per our conversation of expected course. Discussed options for follow up care. Did let this patient know that I do telemedicine on Tuesdays and Thursdays for [place] and those are the days I am logged into the system. Advised to schedule follow up visit with PCP, [place] virtual visits or [facility] if any further questions or concerns to avoid delays in care.   I discussed the assessment and treatment plan with the patient. The patient was provided an opportunity to ask questions and all were answered. The patient agreed with the plan and demonstrated an understanding of the instructions.     Terressa Koyanagi, DO

## 2021-09-10 NOTE — Patient Instructions (Signed)
HOME CARE TIPS:  -COVID19 testing information: [email or web address]  Most pharmacies also offer testing and home test kits. If the Covid19 test is positive and you desire antiviral treatment, please contact a [facility] or schedule a follow up virtual visit through your primary care office or through the [facility].  Other test to treat options: [email or web address]  -I sent the medication(s) we discussed to your pharmacy: Meds ordered this encounter  Medications   molnupiravir EUA (LAGEVRIO) 200 mg CAPS capsule    Sig: Take 4 capsules (800 mg total) by mouth 2 (two) times daily for 5 days.    Dispense:  40 capsule    Refill:  0   benzonatate (TESSALON PERLES) 100 MG capsule    Sig: 1-2 capsules up to twice daily as needed for cough.    Dispense:  30 capsule    Refill:  0     -I sent in the [facility] treatment or referral you requested per our discussion. Please see the information provided below and discuss further with the pharmacist/treatment team.   -there is a chance of rebound illness with covid after improving. This can happen whether or not you take an antiviral treatment. If you become sick again with covid after getting better, please schedule a follow up virtual visit and isolate again.  -can use tylenol  if needed for fevers, aches and pains per instructions  -nasal saline sinus rinses twice daily  -stay hydrated, drink plenty of fluids and eat small healthy meals - avoid dairy  -follow up with your doctor in 2-3 days unless improving and feeling better  -stay home while sick, except to seek medical care. If you have COVID19, you will likely be contagious for 7-10 days. Flu or Influenza is likely contagious for about 7 days. Other respiratory viral infections remain contagious for 5-10+ days depending on the virus and many other factors. Wear a good mask that fits snugly (such as N95 or KN95) if  around others to reduce the risk of transmission.  It was nice to meet you today, and I really hope you are feeling better soon. I help [facility] out with telemedicine visits on Tuesdays and Thursdays and am happy to help if you need a follow up virtual visit on those days. Otherwise, if you have any concerns or questions following this visit please schedule a follow up visit with your Primary Care doctor or seek care at a local urgent care clinic to avoid delays in care.    Seek in person care or schedule a follow up video visit promptly if your symptoms worsen, new concerns arise or you are not improving with treatment. Call 911 and/or seek emergency care if your symptoms are severe or life threatening.  PLEASE SEE THE FOLLOWING LINK FOR THE MOST UPDATED INFORMATION ABOUT LAGEVRIO:  [email or web address]      Fact Sheet for Patients And Caregivers Emergency Use Authorization (EUA) Of LAGEVRIOT (molnupiravir) capsules For Coronavirus Disease 2019 (COVID-19)  What is the most important information I should know about LAGEVRIO? LAGEVRIO may cause serious side effects, including: ? LAGEVRIO may cause harm to your unborn baby. It is not known if LAGEVRIO will harm your baby if you take LAGEVRIO during pregnancy. o LAGEVRIO is not recommended for use in pregnancy. o LAGEVRIO has not been studied in pregnancy. LAGEVRIO was studied in pregnant animals only. When LAGEVRIO was given to pregnant animals, LAGEVRIO caused harm to their unborn babies. o You and your healthcare provider may decide that you should  take LAGEVRIO during pregnancy if there are no other COVID-19 treatment options approved or authorized by the FDA that are accessible or clinically appropriate for you. o If you and your healthcare provider decide that you should take LAGEVRIO during pregnancy, you and your healthcare provider should discuss the known and potential benefits and the potential risks of taking LAGEVRIO  during pregnancy. For individuals who are able to become pregnant: ? You should use a reliable method of birth control (contraception) consistently and correctly during treatment with LAGEVRIO and for 4 days after the last dose of LAGEVRIO. Talk to your healthcare provider about reliable birth control methods. ? Before starting treatment with [facility] your healthcare provider may do a pregnancy test to see if you are pregnant before starting treatment with LAGEVRIO. ? Tell your healthcare provider right away if you become pregnant or think you may be pregnant during treatment with LAGEVRIO. Pregnancy Surveillance Program: ? There is a pregnancy surveillance program for individuals who take LAGEVRIO during pregnancy. The purpose of this program is to collect information about the health of you and your baby. Talk to your healthcare provider about how to take part in this program. ? If you take LAGEVRIO during pregnancy and you agree to participate in the pregnancy surveillance program and allow your healthcare provider to share your information with [place], then your healthcare provider will report your use of [place] during pregnancy to [place]. by calling [phone] or [email or web address]. For individuals who are sexually active with partners who are able to become pregnant: ? It is not known if LAGEVRIO can affect sperm. While the risk is regarded as low, animal studies to fully assess the potential for LAGEVRIO to affect the babies of males treated with LAGEVRIO have not been completed. A reliable method of birth control (contraception) should be used consistently and correctly during treatment with LAGEVRIO and for at least 3 months after the last dose. The risk to sperm beyond 3 months is not known. Studies to understand the risk to sperm beyond 3 months are ongoing. Talk to your healthcare provider about reliable birth control methods. Talk to  your healthcare provider if you have questions or concerns about how LAGEVRIO may affect sperm. You are being given this fact sheet because your healthcare provider believes it is necessary to provide you with LAGEVRIO for the treatment of adults with mild-to-moderate coronavirus disease 2019 (COVID-19) with positive results of direct SARS-CoV-2 viral testing, and who are at high risk for progression to severe COVID-19 including hospitalization or death, and for whom other COVID-19 treatment options approved or authorized by the FDA are not accessible or clinically appropriate. The U.S. Food and Drug Administration (FDA) has issued an Emergency Use Authorization (EUA) to make LAGEVRIO available during the COVID-19 pandemic (for more details about an EUA please see "What is an Emergency Use Authorization?" at the end of this document). LAGEVRIO is not an FDA-approved medicine in the [place]. Read this Fact Sheet for information about LAGEVRIO. Talk to your healthcare provider about your options if you have any questions. It is your choice to take LAGEVRIO.  What is COVID-19? COVID-19 is caused by a virus called a coronavirus. You can get COVID-19 through close contact with another person who has the virus. COVID-19 illnesses have ranged from very mild-to-severe, including illness resulting in death. While information so far suggests that most COVID-19 illness is mild, serious illness can happen and may cause some of your other  medical conditions to become worse. Older people and people of all ages with severe, long lasting (chronic) medical conditions like heart disease, lung disease and diabetes, for example seem to be at higher risk of being hospitalized for COVID-19.  What is LAGEVRIO? LAGEVRIO is an investigational medicine used to treat mild-to-moderate COVID-19 in adults: ? with positive results of direct SARS-CoV-2 viral testing, and ? who are at high risk for progression to  severe COVID-19 including hospitalization or death, and for whom other COVID-19 treatment options approved or authorized by the FDA are not accessible or clinically appropriate. The FDA has authorized the emergency use of LAGEVRIO for the treatment of mild-tomoderate COVID-19 in adults under an EUA. For more information on EUA, see the "What is an Emergency Use Authorization (EUA)?" section at the end of this Fact Sheet. LAGEVRIO is not authorized: ? for use in people less than 14 years of age. ? for prevention of COVID-19. ? for people needing hospitalization for COVID-19. ? for use for longer than 5 consecutive days.  What should I tell my healthcare provider before I take LAGEVRIO? Tell your healthcare provider if you: ? Have any allergies ? Are breastfeeding or plan to breastfeed ? Have any serious illnesses ? Are taking any medicines (prescription, over-the-counter, vitamins, or herbal products).  How do I take LAGEVRIO? ? Take LAGEVRIO exactly as your healthcare provider tells you to take it. ? Take 4 capsules of LAGEVRIO every 12 hours (for example, at 8 am and at 8 pm) ? Take LAGEVRIO for 5 days. It is important that you complete the full 5 days of treatment with LAGEVRIO. Do not stop taking LAGEVRIO before you complete the full 5 days of treatment, even if you feel better. ? Take LAGEVRIO with or without food. ? You should stay in isolation for as long as your healthcare provider tells you to. Talk to your healthcare provider if you are not sure about how to properly isolate while you have COVID-19. ? Swallow LAGEVRIO capsules whole. Do not open, break, or crush the capsules. If you cannot swallow capsules whole, tell your healthcare provider. ? What to do if you miss a dose: o If it has been less than 10 hours since the missed dose, take it as soon as you remember o If it has been more than 10 hours since the missed dose, skip the missed dose and take your dose at the next  scheduled time. ? Do not double the dose of LAGEVRIO to make up for a missed dose.  What are the important possible side effects of LAGEVRIO? ? See, "What is the most important information I should know about LAGEVRIO?" ? Allergic Reactions. Allergic reactions can happen in people taking LAGEVRIO, even after only 1 dose. Stop taking LAGEVRIO and call your healthcare provider right away if you get any of the following symptoms of an allergic reaction: o hives o rapid heartbeat o trouble swallowing or breathing o swelling of the mouth, lips, or face o throat tightness o hoarseness o skin rash The most common side effects of LAGEVRIO are: ? diarrhea ? nausea ? dizziness These are not all the possible side effects of LAGEVRIO. Not many people have taken LAGEVRIO. Serious and unexpected side effects may happen. This medicine is still being studied, so it is possible that all of the risks are not known at this time.  What other treatment choices are there?  Veklury (remdesivir) is FDA-approved as an intravenous (IV) infusion for the treatment  of mildto-moderate DUKRC-38 in certain adults and children. Talk with your doctor to see if Marijean Heath is appropriate for you. Like LAGEVRIO, FDA may also allow for the emergency use of other medicines to treat people with COVID-19. Go to LacrosseProperties.si for more information. It is your choice to be treated or not to be treated with LAGEVRIO. Should you decide not to take it, it will not change your standard medical care.  What if I am breastfeeding? Breastfeeding is not recommended during treatment with LAGEVRIO and for 4 days after the last dose of LAGEVRIO. If you are breastfeeding or plan to breastfeed, talk to your healthcare provider about your options and specific situation before taking LAGEVRIO.  How do I report side effects with  LAGEVRIO? Contact your healthcare provider if you have any side effects that bother you or do not go away. Report side effects to FDA MedWatch at SmoothHits.hu or call 1-800-FDA-1088 (1- ([phone]).  How should I store [place]? ? Store LAGEVRIO capsules at room temperature between 32F to 39F (20C to 25C). ? Keep LAGEVRIO and all medicines out of the reach of children and pets. How can I learn more about COVID-19? ? Ask your healthcare provider. ? Visit SeekRooms.co.uk ? Contact your local or state public health department. ? Call [place] at [phone] (toll free in the [place] ? Visit [email or web address].com  What Is an Emergency Use Authorization (EUA)? The [place] FDA has made [place] available under an emergency access mechanism called an Emergency Use Authorization (EUA) The EUA is supported by a [place], broadcasting Health and Human Service (HHS) declaration that circumstances exist to justify emergency use of drugs and biological products during the COVID-19 pandemic. LAGEVRIO for the treatment of mild-to-moderate COVID-19 in adults with positive results of direct SARS-CoV-2 viral testing, who are at high risk for progression to severe COVID-19, including hospitalization or death, and for whom alternative COVID-19 treatment options approved or authorized by FDA are not accessible or clinically appropriate, has not undergone the same type of review as an FDA-approved product. In issuing an EUA under the OVPCH-40 public health emergency, the FDA has determined, among other things, that based on the total amount of scientific evidence available including data from adequate and well-controlled clinical trials, if available, it is reasonable to believe that the product may be effective for diagnosing, treating, or preventing COVID-19, or a serious or life-threatening disease or condition caused by COVID-19; that the known and potential benefits of the  product, when used to diagnose, treat, or prevent such disease or condition, outweigh the known and potential risks of such product; and that there are no adequate, approved, and available alternatives.  All of these criteria must be met to allow for the product to be used in the treatment of patients during the COVID-19 pandemic. The EUA for LAGEVRIO is in effect for the duration of the COVID-19 declaration justifying emergency use of LAGEVRIO, unless terminated or revoked (after which LAGEVRIO may no longer be used under the EUA). For patent information: http://rogers.info/ Copyright  2021-2022 [place] [place], [place] and its affiliates. All rights reserved. [id number] Revised: March 2022

## 2021-10-07 ENCOUNTER — Other Ambulatory Visit: Payer: Self-pay | Admitting: Primary Care

## 2021-10-07 DIAGNOSIS — E785 Hyperlipidemia, unspecified: Secondary | ICD-10-CM

## 2021-10-07 DIAGNOSIS — I10 Essential (primary) hypertension: Secondary | ICD-10-CM

## 2021-10-07 DIAGNOSIS — E119 Type 2 diabetes mellitus without complications: Secondary | ICD-10-CM

## 2021-11-10 ENCOUNTER — Ambulatory Visit (INDEPENDENT_AMBULATORY_CARE_PROVIDER_SITE_OTHER): Payer: Medicare HMO | Admitting: *Deleted

## 2021-11-10 DIAGNOSIS — Z23 Encounter for immunization: Secondary | ICD-10-CM | POA: Diagnosis not present

## 2021-11-25 LAB — HM DIABETES EYE EXAM

## 2021-12-02 ENCOUNTER — Encounter: Payer: Self-pay | Admitting: Primary Care

## 2021-12-28 ENCOUNTER — Telehealth: Payer: Self-pay | Admitting: Primary Care

## 2021-12-28 NOTE — Telephone Encounter (Signed)
Left message for patient to call back and schedule Medicare Annual Wellness Visit (AWV) either virtually /phone . Left  my Scott Rice number [phone]    awvi 09/12/20 per [facility]     45 min for awv-i a 30 min for awv-s  phone/virtual appointments

## 2022-01-06 ENCOUNTER — Other Ambulatory Visit: Payer: Self-pay | Admitting: Primary Care

## 2022-01-06 DIAGNOSIS — F419 Anxiety disorder, unspecified: Secondary | ICD-10-CM

## 2022-01-06 DIAGNOSIS — L0211 Cutaneous abscess of neck: Secondary | ICD-10-CM

## 2022-01-06 DIAGNOSIS — F32A Depression, unspecified: Secondary | ICD-10-CM

## 2022-01-06 DIAGNOSIS — E1141 Type 2 diabetes mellitus with diabetic mononeuropathy: Secondary | ICD-10-CM

## 2022-01-12 ENCOUNTER — Ambulatory Visit (INDEPENDENT_AMBULATORY_CARE_PROVIDER_SITE_OTHER): Payer: Medicare HMO | Admitting: Primary Care

## 2022-01-12 ENCOUNTER — Encounter: Payer: Self-pay | Admitting: Primary Care

## 2022-01-12 VITALS — BP 140/78 | HR 85 | Temp 97.3°F | Ht 72.0 in | Wt 310.0 lb

## 2022-01-12 DIAGNOSIS — E119 Type 2 diabetes mellitus without complications: Secondary | ICD-10-CM

## 2022-01-12 DIAGNOSIS — Z23 Encounter for immunization: Secondary | ICD-10-CM | POA: Diagnosis not present

## 2022-01-12 DIAGNOSIS — Z794 Long term (current) use of insulin: Secondary | ICD-10-CM | POA: Diagnosis not present

## 2022-01-12 DIAGNOSIS — E113393 Type 2 diabetes mellitus with moderate nonproliferative diabetic retinopathy without macular edema, bilateral: Secondary | ICD-10-CM | POA: Diagnosis not present

## 2022-01-12 DIAGNOSIS — E1165 Type 2 diabetes mellitus with hyperglycemia: Secondary | ICD-10-CM

## 2022-01-12 LAB — POCT GLYCOSYLATED HEMOGLOBIN (HGB A1C): Hemoglobin A1C: 7.2 % — AB (ref 4.0–5.6)

## 2022-01-12 MED ORDER — "INSULIN SYRINGE 30G X 5/16"" 1 ML MISC": 3 refills | Status: AC

## 2022-01-12 NOTE — Progress Notes (Signed)
Subjective:    Patient ID: Scott Rice, male    DOB: 1965/07/23, 56 y.o.   MRN: [id number]  HPI  Scott Rice is a very pleasant 56 y.o. male with a history of type 2 diabetes, hypertension, diabetic neuropathy, hyperlipidemia who presents today for follow up of diabetes.  Current medications include: glipizide 10 mg BID, metformin 1000 mg BID, Insulin NPH 40 units in AM and 35 units in PM  He is checking his blood glucose 2 times daily and is getting readings of:  AM fasting: 120's Before dinner: low 100's  He is experiencing infrequent episodes of hypoglycemia with glucose readings in the 50's.   Last A1C: 7.3 in March 2023, 7.2 today Last Eye Exam: Completed in 2023, positive for DR Last Foot Exam: UTD Pneumonia Vaccination: UTD Urine Microalbumin: Due Statin: atorvastatin   Dietary changes since last visit: Limiting snacking, increased salads   Exercise: Walking some  Wt Readings from Last 3 Encounters:  01/12/22 (!) 310 lb (140.6 kg)  06/10/21 (!) 321 lb 9.6 oz (145.9 kg)  03/02/21 (!) 316 lb (143.3 kg)        Review of Systems  Eyes:  Negative for visual disturbance.  Respiratory:  Negative for shortness of breath.   Cardiovascular:  Negative for chest pain.  Neurological:  Negative for numbness.         Past Medical History:  Diagnosis Date   Anxiety and depression    Diabetic neuropathy (HCC)    Essential hypertension    Hyperlipidemia    Type 2 diabetes mellitus (HCC)     Social History   Socioeconomic History   Marital status: Married    Spouse name: Not on file   Number of children: Not on file   Years of education: Not on file   Highest education level: Not on file  Occupational History   Not on file  Tobacco Use   Smoking status: Former   Smokeless tobacco: Never   Tobacco comments:    quit 40 years ago  Substance and Sexual Activity   Alcohol use: No   Drug use: No   Sexual activity: Not on file  Other Topics  Concern   Not on file  Social History Narrative   Married.   No children.   Works as an Event organiser.   Enjoys watching movies, spending time with family.    Social Determinants of Health   Financial Resource Strain: Not on file  Food Insecurity: Not on file  Transportation Needs: Not on file  Physical Activity: Not on file  Stress: Not on file  Social Connections: Not on file  Intimate Partner Violence: Not on file    History reviewed. No pertinent surgical history.  History reviewed. No pertinent family history.  No Known Allergies  Current Outpatient Medications on File Prior to Visit  Medication Sig Dispense Refill   acetaminophen (TYLENOL) 500 MG tablet Take 1,000 mg by mouth in the morning and at bedtime.     atorvastatin (LIPITOR) 20 MG tablet TAKE 1 TABLET EVERY EVENING FOR CHOLESTEROL 90 tablet 2   B Complex-C (SUPER B COMPLEX PO) Take by mouth 2 (two) times daily.     Calcium Carbonate-Vitamin D (CALCIUM 600/VITAMIN D PO) Take by mouth daily.     cyclobenzaprine (FLEXERIL) 10 MG tablet Take 0.5-1 tablets (5-10 mg total) by mouth at bedtime as needed for muscle spasms. 30 tablet 1   Diclofenac Sodium 1 % CREA Apply topically in the morning, at  noon, and at bedtime.     famotidine (PEPCID) 20 MG tablet Take 20 mg by mouth daily.     FLUoxetine (PROZAC) 20 MG capsule TAKE 1 CAPSULE BY MOUTH DAILY FOR ANXIETY AND DEPRESSION. TAKE WITH 40 MG DOSE. 90 capsule 0   FLUoxetine (PROZAC) 40 MG capsule TAKE 1 CAPSULE EVERY DAY FOR ANXIETY 90 capsule 0   gabapentin (NEURONTIN) 300 MG capsule TAKE 1 CAPSULE TWICE DAILY FOR NERVE PAIN 180 capsule 0   glipiZIDE (GLUCOTROL) 10 MG tablet TAKE 1 TABLET TWICE DAILY BEFORE MEALS. FOR DIABETES 180 tablet 0   insulin NPH-regular Human (NOVOLIN 70/30) (70-30) 100 UNIT/ML injection INJECT 40 UNITS IN THE MORNING AND 35 UNITS AT NIGHT FOR DIABETES. 60 mL 1   lisinopril (ZESTRIL) 10 MG tablet TAKE 1 TABLET EVERY DAY FOR BLOOD PRESSURE  90 tablet 2   meloxicam (MOBIC) 7.5 MG tablet Take 1 tablet (7.5 mg total) by mouth in the morning and at bedtime. As needed for back pain. 180 tablet 0   metFORMIN (GLUCOPHAGE) 1000 MG tablet TAKE 1 TABLET  BY MOUTH 2 (TWO) TIMES DAILY WITH A MEAL FOR DIABETES. 180 tablet 1   mupirocin ointment (BACTROBAN) 2 % APPLY 1 APPLICATION TOPICALLY 2 (TWO) TIMES DAILY. 22 g 0   traZODone (DESYREL) 50 MG tablet TAKE 1/2 TO 1 TABLET  BY MOUTH AT BEDTIME AS NEEDED FOR SLEEP. 90 tablet 0   No current facility-administered medications on file prior to visit.    BP (!) 140/78   Pulse 85   Temp (!) 97.3 F (36.3 C) (Temporal)   Ht 6' (1.829 m)   Wt (!) 310 lb (140.6 kg)   SpO2 99%   BMI 42.04 kg/m  Objective:   Physical Exam Cardiovascular:     Rate and Rhythm: Normal rate and regular rhythm.  Pulmonary:     Effort: Pulmonary effort is normal.     Breath sounds: Normal breath sounds. No wheezing or rales.  Musculoskeletal:     Cervical back: Neck supple.  Skin:    General: Skin is warm and dry.  Neurological:     Mental Status: He is alert and oriented to person, place, and time.           Assessment & Plan:   Problem List Items Addressed This Visit       Endocrine   Type 2 diabetes mellitus with hyperglycemia ([place]) - Primary    Controlled.  Continue NPH 70/30 40 units in AM and 35 units in PM, metformin 1000 mg BID, glipizide 10 mg BID. He will update if he experiences frequent episodes of hypoglycemia.   Managed on statin. Urine microalbumin UTD. Foot exam UTD.   He will schedule an eye appointment.   Follow up in March 2024.       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  Referral placed for ophthalmology.      Relevant Orders   Ambulatory referral to Ophthalmology   Other Visit Diagnoses     Type 2 diabetes mellitus without complication, without long-term current use of insulin ([place])       Relevant Medications   Insulin Syringe-Needle U-100 (INSULIN SYRINGE 1CC/30GX5/16") 30G X 5/16" 1 ML  MISC          Doreene Nest, NP

## 2022-01-12 NOTE — Patient Instructions (Signed)
Continue your diabetes medications as prescribed.  Please schedule a physical to meet with me in March 2024.   You will be contacted regarding your referral to the eye doctor.  Please let us know if you have not been contacted within two weeks.   It was a pleasure to see you today!

## 2022-01-12 NOTE — Assessment & Plan Note (Signed)
Controlled.  Continue NPH 70/30 40 units in AM and 35 units in PM, metformin 1000 mg BID, glipizide 10 mg BID. He will update if he experiences frequent episodes of hypoglycemia.   Managed on statin. Urine microalbumin UTD. Foot exam UTD.   He will schedule an eye appointment.   Follow up in March 2024.

## 2022-01-12 NOTE — Assessment & Plan Note (Signed)
Noted on screening per [facility].  Referral placed for ophthalmology.

## 2022-04-06 DIAGNOSIS — Z0100 Encounter for examination of eyes and vision without abnormal findings: Secondary | ICD-10-CM | POA: Diagnosis not present

## 2022-04-06 DIAGNOSIS — E113213 Type 2 diabetes mellitus with mild nonproliferative diabetic retinopathy with macular edema, bilateral: Secondary | ICD-10-CM | POA: Diagnosis not present

## 2022-04-06 DIAGNOSIS — Z961 Presence of intraocular lens: Secondary | ICD-10-CM | POA: Diagnosis not present

## 2022-04-06 DIAGNOSIS — H43813 Vitreous degeneration, bilateral: Secondary | ICD-10-CM | POA: Diagnosis not present

## 2022-04-06 LAB — HM DIABETES EYE EXAM

## 2022-04-30 ENCOUNTER — Telehealth: Payer: Self-pay

## 2022-04-30 DIAGNOSIS — E119 Type 2 diabetes mellitus without complications: Secondary | ICD-10-CM

## 2022-04-30 MED ORDER — METFORMIN HCL 1000 MG PO TABS: ORAL_TABLET | ORAL | 0 refills | Status: DC

## 2022-04-30 NOTE — Telephone Encounter (Signed)
Received refill request for

## 2022-04-30 NOTE — Addendum Note (Signed)
Addended by: Pleas Koch on: 04/30/2022 11:48 AM   Modules accepted: Orders

## 2022-04-30 NOTE — Telephone Encounter (Signed)
Refills sent to pharmacy. 

## 2022-05-12 ENCOUNTER — Other Ambulatory Visit: Payer: Self-pay | Admitting: Primary Care

## 2022-05-12 DIAGNOSIS — E119 Type 2 diabetes mellitus without complications: Secondary | ICD-10-CM

## 2022-05-12 DIAGNOSIS — E1140 Type 2 diabetes mellitus with diabetic neuropathy, unspecified: Secondary | ICD-10-CM

## 2022-05-12 MED ORDER — GLIPIZIDE 10 MG PO TABS: 10.0000 mg | ORAL_TABLET | Freq: Two times a day (BID) | ORAL | 0 refills | Status: DC

## 2022-05-12 MED ORDER — NOVOLIN 70/30 (70-30) 100 UNIT/ML ~~LOC~~ SUSP: SUBCUTANEOUS | 0 refills | Status: DC

## 2022-05-18 DIAGNOSIS — E113211 Type 2 diabetes mellitus with mild nonproliferative diabetic retinopathy with macular edema, right eye: Secondary | ICD-10-CM | POA: Diagnosis not present

## 2022-05-18 DIAGNOSIS — Z961 Presence of intraocular lens: Secondary | ICD-10-CM | POA: Diagnosis not present

## 2022-05-18 DIAGNOSIS — E113213 Type 2 diabetes mellitus with mild nonproliferative diabetic retinopathy with macular edema, bilateral: Secondary | ICD-10-CM | POA: Diagnosis not present

## 2022-05-18 DIAGNOSIS — E113212 Type 2 diabetes mellitus with mild nonproliferative diabetic retinopathy with macular edema, left eye: Secondary | ICD-10-CM | POA: Diagnosis not present

## 2022-06-08 ENCOUNTER — Ambulatory Visit (INDEPENDENT_AMBULATORY_CARE_PROVIDER_SITE_OTHER): Payer: Medicare HMO

## 2022-06-08 ENCOUNTER — Encounter: Payer: Self-pay | Admitting: Primary Care

## 2022-06-08 ENCOUNTER — Ambulatory Visit (INDEPENDENT_AMBULATORY_CARE_PROVIDER_SITE_OTHER): Payer: Medicare HMO | Admitting: Primary Care

## 2022-06-08 VITALS — BP 138/64 | HR 87 | Temp 97.5°F | Resp 17 | Ht 72.0 in | Wt 309.6 lb

## 2022-06-08 VITALS — BP 138/80 | HR 80 | Temp 98.0°F | Ht 72.0 in | Wt 311.0 lb

## 2022-06-08 DIAGNOSIS — F32A Depression, unspecified: Secondary | ICD-10-CM

## 2022-06-08 DIAGNOSIS — Z794 Long term (current) use of insulin: Secondary | ICD-10-CM

## 2022-06-08 DIAGNOSIS — F419 Anxiety disorder, unspecified: Secondary | ICD-10-CM | POA: Diagnosis not present

## 2022-06-08 DIAGNOSIS — Z0000 Encounter for general adult medical examination without abnormal findings: Secondary | ICD-10-CM

## 2022-06-08 DIAGNOSIS — E113393 Type 2 diabetes mellitus with moderate nonproliferative diabetic retinopathy without macular edema, bilateral: Secondary | ICD-10-CM

## 2022-06-08 DIAGNOSIS — I10 Essential (primary) hypertension: Secondary | ICD-10-CM

## 2022-06-08 DIAGNOSIS — Z6841 Body Mass Index (BMI) 40.0 and over, adult: Secondary | ICD-10-CM | POA: Diagnosis not present

## 2022-06-08 DIAGNOSIS — Z125 Encounter for screening for malignant neoplasm of prostate: Secondary | ICD-10-CM | POA: Diagnosis not present

## 2022-06-08 DIAGNOSIS — K219 Gastro-esophageal reflux disease without esophagitis: Secondary | ICD-10-CM

## 2022-06-08 DIAGNOSIS — Z0001 Encounter for general adult medical examination with abnormal findings: Secondary | ICD-10-CM

## 2022-06-08 DIAGNOSIS — E785 Hyperlipidemia, unspecified: Secondary | ICD-10-CM | POA: Diagnosis not present

## 2022-06-08 DIAGNOSIS — M545 Low back pain, unspecified: Secondary | ICD-10-CM

## 2022-06-08 DIAGNOSIS — E1165 Type 2 diabetes mellitus with hyperglycemia: Secondary | ICD-10-CM

## 2022-06-08 DIAGNOSIS — E1141 Type 2 diabetes mellitus with diabetic mononeuropathy: Secondary | ICD-10-CM | POA: Diagnosis not present

## 2022-06-08 DIAGNOSIS — R32 Unspecified urinary incontinence: Secondary | ICD-10-CM | POA: Insufficient documentation

## 2022-06-08 DIAGNOSIS — G8929 Other chronic pain: Secondary | ICD-10-CM

## 2022-06-08 LAB — COMPREHENSIVE METABOLIC PANEL
ALT: 21 U/L (ref 0–53)
AST: 16 U/L (ref 0–37)
Albumin: 4.5 g/dL (ref 3.5–5.2)
Alkaline Phosphatase: 82 U/L (ref 39–117)
BUN: 19 mg/dL (ref 6–23)
CO2: 25 mEq/L (ref 19–32)
Calcium: 9.7 mg/dL (ref 8.4–10.5)
Chloride: 101 mEq/L (ref 96–112)
Creatinine, Ser: 1.03 mg/dL (ref 0.40–1.50)
GFR: 80.64 mL/min (ref >60.00)
Glucose, Bld: 217 mg/dL — ABNORMAL HIGH (ref 70–99)
Potassium: 4.8 mEq/L (ref 3.5–5.1)
Sodium: 134 mEq/L — ABNORMAL LOW (ref 135–145)
Total Bilirubin: 0.8 mg/dL (ref 0.2–1.2)
Total Protein: 7.3 g/dL (ref 6.0–8.3)

## 2022-06-08 LAB — LIPID PANEL
Cholesterol: 100 mg/dL (ref 0–200)
HDL: 45.4 mg/dL (ref >39.00)
LDL Cholesterol: 35 mg/dL (ref 0–99)
NonHDL: 54.79
Total CHOL/HDL Ratio: 2
Triglycerides: 101 mg/dL (ref 0.0–149.0)
VLDL: 20.2 mg/dL (ref 0.0–40.0)

## 2022-06-08 LAB — PSA, MEDICARE: PSA: 0.22 ng/ml (ref 0.10–4.00)

## 2022-06-08 LAB — HEMOGLOBIN A1C: Hgb A1c MFr Bld: 7.8 % — ABNORMAL HIGH (ref 4.6–6.5)

## 2022-06-08 NOTE — Progress Notes (Signed)
Subjective:   Scott Rice is a 58 y.o. male who presents for an Initial Medicare Annual Wellness Visit.  Review of Systems     Cardiac Risk Factors include: advanced age (>16men, >47 women);diabetes mellitus;hypertension;male gender;sedentary lifestyle     Objective:    Today's Vitals   06/08/22 0941 06/08/22 0953  BP: 138/64   Pulse: 87   Resp: 17   Temp: (!) 97.5 F (36.4 C)   SpO2: 97%   Weight: (!) 309 lb 9.6 oz (140.4 kg)   Height: 6' (1.829 m)   PainSc:  2    Body mass index is 41.99 kg/m.     06/08/2022   10:07 AM  Advanced Directives  Does Patient Have a Medical Advance Directive? No  Would patient like information on creating a medical advance directive? No - Patient declined    Current Medications (verified) Outpatient Encounter Medications as of 06/08/2022  Medication Sig   acetaminophen (TYLENOL) 500 MG tablet Take 1,000 mg by mouth in the morning and at bedtime.   atorvastatin (LIPITOR) 20 MG tablet TAKE 1 TABLET EVERY EVENING FOR CHOLESTEROL   B Complex-C (SUPER B COMPLEX PO) Take by mouth 2 (two) times daily.   Calcium Carbonate-Vitamin D (CALCIUM 600/VITAMIN D PO) Take by mouth daily.   cyclobenzaprine (FLEXERIL) 10 MG tablet Take 0.5-1 tablets (5-10 mg total) by mouth at bedtime as needed for muscle spasms.   Diclofenac Sodium 1 % CREA Apply topically in the morning, at noon, and at bedtime.   famotidine (PEPCID) 20 MG tablet Take 20 mg by mouth daily.   FLUoxetine (PROZAC) 20 MG capsule TAKE 1 CAPSULE BY MOUTH DAILY FOR ANXIETY AND DEPRESSION. TAKE WITH 40 MG DOSE.   FLUoxetine (PROZAC) 40 MG capsule TAKE 1 CAPSULE EVERY DAY FOR ANXIETY   gabapentin (NEURONTIN) 300 MG capsule TAKE 1 CAPSULE TWICE DAILY FOR NERVE PAIN   glipiZIDE (GLUCOTROL) 10 MG tablet Take 1 tablet (10 mg total) by mouth 2 (two) times daily before a meal. for diabetes.   insulin NPH-regular Human (NOVOLIN 70/30) (70-30) 100 UNIT/ML injection Inject 40 units in the morning and  35 units at night for diabetes.   Insulin Syringe-Needle U-100 (INSULIN SYRINGE 1CC/30GX5/16") 30G X 5/16" 1 ML MISC Inject twice daily with insulin.   lisinopril (ZESTRIL) 10 MG tablet TAKE 1 TABLET EVERY DAY FOR BLOOD PRESSURE   meloxicam (MOBIC) 7.5 MG tablet Take 1 tablet (7.5 mg total) by mouth in the morning and at bedtime. As needed for back pain.   metFORMIN (GLUCOPHAGE) 1000 MG tablet TAKE 1 TABLET  BY MOUTH 2 (TWO) TIMES DAILY WITH A MEAL FOR DIABETES.   mupirocin ointment (BACTROBAN) 2 % APPLY 1 APPLICATION TOPICALLY 2 (TWO) TIMES DAILY.   traZODone (DESYREL) 50 MG tablet TAKE 1/2 TO 1 TABLET  BY MOUTH AT BEDTIME AS NEEDED FOR SLEEP.   No facility-administered encounter medications on file as of 06/08/2022.    Allergies (verified) Patient has no known allergies.   History: Past Medical History:  Diagnosis Date   Anxiety and depression    Diabetic neuropathy ([place])    Essential hypertension    Hyperlipidemia    Type 2 diabetes mellitus ([place])    History reviewed. No pertinent surgical history. History reviewed. No pertinent family history. Social History   Socioeconomic History   Marital status: Married    Spouse name: Not on file   Number of children: Not on file   Years of education: Not on file  Highest education level: Not on file  Occupational History   Not on file  Tobacco Use   Smoking status: Former   Smokeless tobacco: Never   Tobacco comments:    quit 40 years ago  Substance and Sexual Activity   Alcohol use: No   Drug use: No   Sexual activity: Not on file  Other Topics Concern   Not on file  Social History Narrative   Married.   No children.   Works as an Chief Executive Officer.   Enjoys watching movies, spending time with family.    Social Determinants of Health   Financial Resource Strain: Low Risk  (06/08/2022)   Overall Financial Resource Strain (CARDIA)    Difficulty of Paying Living Expenses: Not hard at all  Food Insecurity: No Food  Insecurity (06/08/2022)   Hunger Vital Sign    Worried About Running Out of Food in the Last Year: Never true    Ran Out of Food in the Last Year: Never true  Transportation Needs: No Transportation Needs (06/08/2022)   PRAPARE - Hydrologist (Medical): No    Lack of Transportation (Non-Medical): No  Physical Activity: Sufficiently Active (06/08/2022)   Exercise Vital Sign    Days of Exercise per Week: 7 days    Minutes of Exercise per Session: 30 min  Stress: Stress Concern Present (06/08/2022)   [place]    Feeling of Stress : Very much  Social Connections: Moderately Isolated (06/08/2022)   Social Connection and Isolation Panel [NHANES]    Frequency of Communication with Friends and Family: Twice a week    Frequency of Social Gatherings with Friends and Family: Twice a week    Attends Religious Services: Never    Marine scientist or Organizations: No    Attends Music therapist: Never    Marital Status: Married    Tobacco Counseling Counseling given: Not Answered Tobacco comments: quit 40 years ago   Clinical Intake:  Pre-visit preparation completed: Yes  Pain : 0-10 Pain Score: 2  Pain Type: Chronic pain (neuropathy per pt) Pain Location: Foot Pain Orientation: Right, Left Pain Descriptors / Indicators: Tingling Pain Onset: More than a month ago     Nutritional Risks: Unintentional weight gain (30lb weight gain in 35mo) Diabetes: No  How often do you need to have someone help you when you read instructions, pamphlets, or other written materials from your doctor or pharmacy?: 1 - Never  Diabetic?Nutrition Risk Assessment:  Has the patient had any N/V/D within the last 2 months?  No  Does the patient have any non-healing wounds?  No  Has the patient had any unintentional weight loss or weight gain?  Yes  Pt states he is up 30 lbs in 97mo. Diabetes:  Is  the patient diabetic?  Yes  If diabetic, was a CBG obtained today?  Yes ,160 today per pt. Did the patient bring in their glucometer from home?  No  How often do you monitor your CBG's? QD.   Financial Strains and Diabetes Management:  Are you having any financial strains with the device, your supplies or your medication? No .  Does the patient want to be seen by Chronic Care Management for management of their diabetes?  No  Would the patient like to be referred to a Nutritionist or for Diabetic Management?  No   Diabetic Exams:  Diabetic Eye Exam: Completed 05/18/22  [facility]  Diabetic Foot Exam: Completed 02/24/21 PCP    Interpreter Needed?: No  Information entered by :: C.Ellawyn Wogan LPN   Activities of Daily Living    06/08/2022   10:07 AM  In your present state of health, do you have any difficulty performing the following activities:  Hearing? 0  Vision? 0  Difficulty concentrating or making decisions? 1  Comment occasionlly forgets  Walking or climbing stairs? 0  Dressing or bathing? 0  Doing errands, shopping? 0  Preparing Food and eating ? N  Using the Toilet? N  In the past six months, have you accidently leaked urine? Y  Comment occasional  Do you have problems with loss of bowel control? N  Managing your Medications? N  Managing your Finances? N  Housekeeping or managing your Housekeeping? N    Patient Care Team: Pleas Koch, NP as PCP - General (Internal Medicine)  Indicate any recent Medical Services you may have received from other than [facility] providers in the past year (date may be approximate).     Assessment:   This is a routine wellness examination for [place].  Hearing/Vision screen Hearing Screening - Comments:: No aid Vision Screening - Comments:: Readers - [facility]  Dietary issues and exercise activities discussed: Current Exercise Habits: Home exercise routine, Time (Minutes): 30, Frequency (Times/Week): 7, Weekly Exercise  (Minutes/Week): 210, Intensity: Mild, Exercise limited by: Other - see comments (neuropathy)   Goals Addressed             This Visit's Progress    Patient Stated       Get retinopathy under control.       Depression Screen    06/08/2022   10:02 AM 06/10/2021   10:25 AM 05/20/2020   11:15 AM  PHQ 2/9 Scores  PHQ - 2 Score 5 5 4   PHQ- 9 Score 18 13 13     Fall Risk    06/08/2022    9:56 AM 06/10/2021    9:31 AM  Fall Risk   Falls in the past year? 1 0  Number falls in past yr: 1   Comment gets dizzy and trips   Injury with Fall? 0   Risk for fall due to : Impaired balance/gait;Other (Comment)   Risk for fall due to: Comment dizziness   Follow up Falls evaluation completed;Education provided;Falls prevention discussed Falls evaluation completed    FALL RISK PREVENTION PERTAINING TO THE HOME:  Any stairs in or around the home? Yes  If so, are there any without handrails? No  Home free of loose throw rugs in walkways, pet beds, electrical cords, etc? Yes  Adequate lighting in your home to reduce risk of falls? Yes   ASSISTIVE DEVICES UTILIZED TO PREVENT FALLS:  Life alert? No  Use of a cane, walker or w/c? Yes , uses cane on occasion Grab bars in the bathroom? Yes  Shower chair or bench in shower? No  Elevated toilet seat or a handicapped toilet? Yes   TIMED UP AND GO:  Was the test performed? Yes .  Length of time to ambulate 10 feet: 10 sec.   Gait steady and fast without use of assistive device  Cognitive Function:        06/08/2022   10:09 AM  6CIT Screen  What Year? 0 points  What month? 0 points  What time? 0 points  Count back from 20 0 points  Months in reverse 0 points  Repeat phrase 0 points  Total Score 0 points  Immunizations Immunization History  Administered Date(s) Administered   Influenza,inj,Quad PF,6+ Mos 05/20/2020, 02/24/2021, 01/12/2022   Influenza-Unspecified 12/27/2017, 01/02/2019   Pneumococcal Polysaccharide-23  10/28/2016   Tdap 05/16/2018   Zoster Recombinat (Shingrix) 06/10/2021, 11/10/2021    TDAP status: Up to date  Flu Vaccine status: Up to date  Pneumococcal vaccine status: Up to date  Covid-19 vaccine status: Declined, Education has been provided regarding the importance of this vaccine but patient still declined. Advised may receive this vaccine at local pharmacy or Health Dept.or vaccine clinic. Aware to provide a copy of the vaccination record if obtained from local pharmacy or Health Dept. Verbalized acceptance and understanding.  Qualifies for Shingles Vaccine? Yes   Zostavax completed  unknown   Shingrix Completed?: Yes  Screening Tests Health Maintenance  Topic Date Due   COVID-19 Vaccine (1) Never done   Diabetic kidney evaluation - Urine ACR  Never done   FOOT EXAM  02/24/2022   Diabetic kidney evaluation - eGFR measurement  06/11/2022   COLONOSCOPY (Pts 45-97yrs Insurance coverage will need to be confirmed)  01/13/2023 (Originally 02/13/2020)   HEMOGLOBIN A1C  07/13/2022   OPHTHALMOLOGY EXAM  05/18/2023   Medicare Annual Wellness (AWV)  06/08/2023   DTaP/Tdap/Td (2 - Td or Tdap) 05/15/2028   INFLUENZA VACCINE  Completed   Hepatitis C Screening  Completed   HIV Screening  Completed   Zoster Vaccines- Shingrix  Completed   HPV VACCINES  Aged Out    Health Maintenance  Health Maintenance Due  Topic Date Due   COVID-19 Vaccine (1) Never done   Diabetic kidney evaluation - Urine ACR  Never done   FOOT EXAM  02/24/2022   Diabetic kidney evaluation - eGFR measurement  06/11/2022    Colorectal cancer screening: Type of screening: Colonoscopy. Completed 02/12/10. Repeat every 10 years pt declines.  Lung Cancer Screening: (Low Dose CT Chest recommended if Age 88-80 years, 30 pack-year currently smoking OR have quit w/in 15years.) does not qualify.   Lung Cancer Screening Referral: no  Additional Screening:  Hepatitis C Screening: does not qualify; Completed  no  Vision Screening: Recommended annual ophthalmology exams for early detection of glaucoma and other disorders of the eye. Is the patient up to date with their annual eye exam?  Yes  Who is the provider or what is the name of the office in which the patient attends annual eye exams? [facility] If pt is not established with a provider, would they like to be referred to a provider to establish care? No .   Dental Screening: Recommended annual dental exams for proper oral hygiene  Community Resource Referral / Chronic Care Management: CRR required this visit?  No   CCM required this visit?  No      Plan:     I have personally reviewed and noted the following in the patient's chart:   Medical and social history Use of alcohol, tobacco or illicit drugs  Current medications and supplements including opioid prescriptions. Patient is not currently taking opioid prescriptions. Functional ability and status Nutritional status Physical activity Advanced directives List of other physicians Hospitalizations, surgeries, and ER visits in previous 12 months Vitals Screenings to include cognitive, depression, and falls Referrals and appointments  In addition, I have reviewed and discussed with patient certain preventive protocols, quality metrics, and best practice recommendations. A written personalized care plan for preventive services as well as general preventive health recommendations were provided to patient.     Jaylianna Tatlock Moody Bruins, LPN  06/08/2022   Nurse Notes: Pt c/o depression but declines referral.Pt declined referral for colonoscopy.

## 2022-06-08 NOTE — Patient Instructions (Signed)
Scott Rice , Thank you for taking time to come for your Medicare Wellness Visit. I appreciate your ongoing commitment to your health goals. Please review the following plan we discussed and let me know if I can assist you in the future.   These are the goals we discussed:  Goals      Patient Stated     Get retinopathy under control.        This is a list of the screening recommended for you and due dates:  Health Maintenance  Topic Date Due   COVID-19 Vaccine (1) Never done   Yearly kidney health urinalysis for diabetes  Never done   Complete foot exam   02/24/2022   Yearly kidney function blood test for diabetes  06/11/2022   Colon Cancer Screening  01/13/2023*   Hemoglobin A1C  07/13/2022   Eye exam for diabetics  05/18/2023   Medicare Annual Wellness Visit  06/08/2023   DTaP/Tdap/Td vaccine (2 - Td or Tdap) 05/15/2028   Flu Shot  Completed   Hepatitis C Screening: USPSTF Recommendation to screen - Ages 18-79 yo.  Completed   HIV Screening  Completed   Zoster (Shingles) Vaccine  Completed   HPV Vaccine  Aged Out  *Topic was postponed. The date shown is not the original due date.    Advanced directives: Advance directive discussed with you today. Even though you declined this today, please call our office should you change your mind, and we can give you the proper paperwork for you to fill out.   Conditions/risks identified: Aim for 30 minutes of exercise or brisk walking, 6-8 glasses of water, and 5 servings of fruits and vegetables each day.   Next appointment: Follow up in one year for your annual wellness visit 06/09/23 @ 1:00 telephone visit  Preventive Care 40-64 Years, Male Preventive care refers to lifestyle choices and visits with your health care provider that can promote health and wellness. What does preventive care include? A yearly physical exam. This is also called an annual well check. Dental exams once or twice a year. Routine eye exams. Ask your health  care provider how often you should have your eyes checked. Personal lifestyle choices, including: Daily care of your teeth and gums. Regular physical activity. Eating a healthy diet. Avoiding tobacco and drug use. Limiting alcohol use. Practicing safe sex. Taking low-dose aspirin every day starting at age 30. What happens during an annual well check? The services and screenings done by your health care provider during your annual well check will depend on your age, overall health, lifestyle risk factors, and family history of disease. Counseling  Your health care provider may ask you questions about your: Alcohol use. Tobacco use. Drug use. Emotional well-being. Home and relationship well-being. Sexual activity. Eating habits. Work and work Statistician. Screening  You may have the following tests or measurements: Height, weight, and BMI. Blood pressure. Lipid and cholesterol levels. These may be checked every 5 years, or more frequently if you are over 51 years old. Skin check. Lung cancer screening. You may have this screening every year starting at age 88 if you have a 30-pack-year history of smoking and currently smoke or have quit within the past 15 years. Fecal occult blood test (FOBT) of the stool. You may have this test every year starting at age 66. Flexible sigmoidoscopy or colonoscopy. You may have a sigmoidoscopy every 5 years or a colonoscopy every 10 years starting at age 41. Prostate cancer screening. Recommendations will  vary depending on your family history and other risks. Hepatitis C blood test. Hepatitis B blood test. Sexually transmitted disease (STD) testing. Diabetes screening. This is done by checking your blood sugar (glucose) after you have not eaten for a while (fasting). You may have this done every 1-3 years. Discuss your test results, treatment options, and if necessary, the need for more tests with your health care provider. Vaccines  Your health  care provider may recommend certain vaccines, such as: Influenza vaccine. This is recommended every year. Tetanus, diphtheria, and acellular pertussis (Tdap, Td) vaccine. You may need a Td booster every 10 years. Zoster vaccine. You may need this after age 72. Pneumococcal 13-valent conjugate (PCV13) vaccine. You may need this if you have certain conditions and have not been vaccinated. Pneumococcal polysaccharide (PPSV23) vaccine. You may need one or two doses if you smoke cigarettes or if you have certain conditions. Talk to your health care provider about which screenings and vaccines you need and how often you need them. This information is not intended to replace advice given to you by your health care provider. Make sure you discuss any questions you have with your health care provider. Document Released: 03/28/2015 Document Revised: 11/19/2015 Document Reviewed: 12/31/2014 Elsevier Interactive Patient Education  2017 [place] Prevention in the Home Falls can cause injuries. They can happen to people of all ages. There are many things you can do to make your home safe and to help prevent falls. What can I do on the outside of my home? Regularly fix the edges of walkways and driveways and fix any cracks. Remove anything that might make you trip as you walk through a door, such as a raised step or threshold. Trim any bushes or trees on the path to your home. Use bright outdoor lighting. Clear any walking paths of anything that might make someone trip, such as rocks or tools. Regularly check to see if handrails are loose or broken. Make sure that both sides of any steps have handrails. Any raised decks and porches should have guardrails on the edges. Have any leaves, snow, or ice cleared regularly. Use sand or salt on walking paths during winter. Clean up any spills in your garage right away. This includes oil or grease spills. What can I do in the bathroom? Use night  lights. Install grab bars by the toilet and in the tub and shower. Do not use towel bars as grab bars. Use non-skid mats or decals in the tub or shower. If you need to sit down in the shower, use a plastic, non-slip stool. Keep the floor dry. Clean up any water that spills on the floor as soon as it happens. Remove soap buildup in the tub or shower regularly. Attach bath mats securely with double-sided non-slip rug tape. Do not have throw rugs and other things on the floor that can make you trip. What can I do in the bedroom? Use night lights. Make sure that you have a light by your bed that is easy to reach. Do not use any sheets or blankets that are too big for your bed. They should not hang down onto the floor. Have a firm chair that has side arms. You can use this for support while you get dressed. Do not have throw rugs and other things on the floor that can make you trip. What can I do in the kitchen? Clean up any spills right away. Avoid walking on wet floors. Keep items that  you use a lot in easy-to-reach places. If you need to reach something above you, use a strong step stool that has a grab bar. Keep electrical cords out of the way. Do not use floor polish or wax that makes floors slippery. If you must use wax, use non-skid floor wax. Do not have throw rugs and other things on the floor that can make you trip. What can I do with my stairs? Do not leave any items on the stairs. Make sure that there are handrails on both sides of the stairs and use them. Fix handrails that are broken or loose. Make sure that handrails are as long as the stairways. Check any carpeting to make sure that it is firmly attached to the stairs. Fix any carpet that is loose or worn. Avoid having throw rugs at the top or bottom of the stairs. If you do have throw rugs, attach them to the floor with carpet tape. Make sure that you have a light switch at the top of the stairs and the bottom of the stairs. If  you do not have them, ask someone to add them for you. What else can I do to help prevent falls? Wear shoes that: Do not have high heels. Have rubber bottoms. Are comfortable and fit you well. Are closed at the toe. Do not wear sandals. If you use a stepladder: Make sure that it is fully opened. Do not climb a closed stepladder. Make sure that both sides of the stepladder are locked into place. Ask someone to hold it for you, if possible. Clearly mark and make sure that you can see: Any grab bars or handrails. First and last steps. Where the edge of each step is. Use tools that help you move around (mobility aids) if they are needed. These include: Canes. Walkers. Scooters. Crutches. Turn on the lights when you go into a dark area. Replace any light bulbs as soon as they burn out. Set up your furniture so you have a clear path. Avoid moving your furniture around. If any of your floors are uneven, fix them. If there are any pets around you, be aware of where they are. Review your medicines with your doctor. Some medicines can make you feel dizzy. This can increase your chance of falling. Ask your doctor what other things that you can do to help prevent falls. This information is not intended to replace advice given to you by your health care provider. Make sure you discuss any questions you have with your health care provider. Document Released: 12/26/2008 Document Revised: 08/07/2015 Document Reviewed: 04/05/2014 Elsevier Interactive Patient Education  2017 [employer].

## 2022-06-08 NOTE — Assessment & Plan Note (Addendum)
Overall controlled.  Continue meloxicam 7.5 mg as needed and cyclobenzaprine 10 mg PRN. Continue gabapentin 300 mg BID.

## 2022-06-08 NOTE — Assessment & Plan Note (Signed)
Overall controlled.  Continue fluoxetine 60 mg daily. Continue Trazodone 25-50 mg HS PRN.

## 2022-06-08 NOTE — Assessment & Plan Note (Signed)
Overall controlled.  Continue lisinopril 10 mg daily.  I've asked for him to start checking BP at home and send readings in 1-2 weeks.  Consider increasing dose of lisinopril to 20 mg daily.  CMP pending.

## 2022-06-08 NOTE — Assessment & Plan Note (Addendum)
Repeat A1C pending.  Continue metformin 1000 mg BID and glipizide 10 mg BID.  Continue Novolin 70/30, 40 units in AM and 35 units in PM.  Follow up in 3-6 months based on A1C result.   Consider Jardiance vs Trulicity.   Goal A1C is <7.

## 2022-06-08 NOTE — Assessment & Plan Note (Signed)
Following with ophthalmology.  Continue injections.

## 2022-06-08 NOTE — Patient Instructions (Addendum)
Stop by the lab prior to leaving today. I will notify you of your results once received.   Please schedule a follow up visit for 6 months for a diabetes check.  It was a pleasure to see you today!  

## 2022-06-08 NOTE — Addendum Note (Signed)
Addended by: Susy Manor on: [id number] 11:57 AM   Modules accepted: Orders

## 2022-06-08 NOTE — Assessment & Plan Note (Signed)
Immunizations UTD. Colonoscopy overdue, declines today. Also declines Cologuard. PSA due and pending.  Discussed the importance of a healthy diet and regular exercise in order for weight loss, and to reduce the risk of further co-morbidity.  Exam stable. Labs pending.  Follow up in 1 year for repeat physical.

## 2022-06-08 NOTE — Assessment & Plan Note (Signed)
Checking labs today. Consider oxybutynin vs tamsulosin.

## 2022-06-08 NOTE — Progress Notes (Signed)
Subjective:    Patient ID: Scott Rice, male    DOB: November 12, 1965, 57 y.o.   MRN: [id number]  HPI  Scott Rice is a very pleasant 57 y.o. male who presents today for complete physical and follow up of chronic conditions.  He would also like to discuss urinary leakage. Chronic over the last 8-11 months, occurs twice weekly on average. Occurs intermittently just prior to urinating and when getting up to use the bathroom during the night. He does have a weak urinary stream on rare occasions. He denies hematuria, urgency.   Immunizations: -Tetanus: Completed in 2020 -Influenza: Completed this season -Shingles: Completed Shingrix series -Pneumonia: Completed 2018  Diet: [place].  Exercise: No regular exercise.  Eye exam: Completes annually  Dental exam: Completes semi-annually    Colonoscopy: Overdue, declined last year. Declines this year and declines Cologuard.   PSA: Due  BP Readings from Last 3 Encounters:  06/08/22 138/80  06/08/22 138/64  01/12/22 (!) 140/78       Review of Systems  Constitutional:  Negative for unexpected weight change.  HENT:  Negative for rhinorrhea.   Eyes:  Positive for visual disturbance.  Respiratory:  Negative for cough and shortness of breath.   Cardiovascular:  Negative for chest pain.  Gastrointestinal:  Negative for constipation and diarrhea.  Genitourinary:  Negative for difficulty urinating.       Urinary leakage  Musculoskeletal:  Positive for arthralgias and back pain. Negative for myalgias.  Skin:  Negative for rash.  Allergic/Immunologic: Negative for environmental allergies.  Neurological:  Positive for dizziness and numbness. Negative for headaches.  Psychiatric/Behavioral:  The patient is not nervous/anxious.          Past Medical History:  Diagnosis Date   Anxiety and depression    Cellulitis 02/24/2021   Cellulitis and abscess of neck 03/02/2021   Diabetic neuropathy (HCC)    Essential hypertension     Hyperlipidemia    Type 2 diabetes mellitus ([place])     Social History   Socioeconomic History   Marital status: Married    Spouse name: Not on file   Number of children: Not on file   Years of education: Not on file   Highest education level: Not on file  Occupational History   Not on file  Tobacco Use   Smoking status: Former   Smokeless tobacco: Never   Tobacco comments:    quit 40 years ago  Substance and Sexual Activity   Alcohol use: No   Drug use: No   Sexual activity: Not on file  Other Topics Concern   Not on file  Social History Narrative   Married.   No children.   Works as an Chief Executive Officer.   Enjoys watching movies, spending time with family.    Social Determinants of Health   Financial Resource Strain: Low Risk  (06/08/2022)   Overall Financial Resource Strain (CARDIA)    Difficulty of Paying Living Expenses: Not hard at all  Food Insecurity: No Food Insecurity (06/08/2022)   Hunger Vital Sign    Worried About Running Out of Food in the Last Year: Never true    Ran Out of Food in the Last Year: Never true  Transportation Needs: No Transportation Needs (06/08/2022)   PRAPARE - Hydrologist (Medical): No    Lack of Transportation (Non-Medical): No  Physical Activity: Sufficiently Active (06/08/2022)   Exercise Vital Sign    Days of Exercise per Week: 7 days  Minutes of Exercise per Session: 30 min  Stress: Stress Concern Present (06/08/2022)   [place]    Feeling of Stress : Very much  Social Connections: Moderately Isolated (06/08/2022)   Social Connection and Isolation Panel [NHANES]    Frequency of Communication with Friends and Family: Twice a week    Frequency of Social Gatherings with Friends and Family: Twice a week    Attends Religious Services: Never    Marine scientist or Organizations: No    Attends Archivist Meetings: Never     Marital Status: Married  Human resources officer Violence: Not At Risk (06/08/2022)   Humiliation, Afraid, Rape, and Kick questionnaire    Fear of Current or Ex-Partner: No    Emotionally Abused: No    Physically Abused: No    Sexually Abused: No    History reviewed. No pertinent surgical history.  History reviewed. No pertinent family history.  No Known Allergies  Current Outpatient Medications on File Prior to Visit  Medication Sig Dispense Refill   acetaminophen (TYLENOL) 500 MG tablet Take 1,000 mg by mouth in the morning and at bedtime.     atorvastatin (LIPITOR) 20 MG tablet TAKE 1 TABLET EVERY EVENING FOR CHOLESTEROL 90 tablet 2   B Complex-C (SUPER B COMPLEX PO) Take by mouth 2 (two) times daily.     Calcium Carbonate-Vitamin D (CALCIUM 600/VITAMIN D PO) Take by mouth daily.     cyclobenzaprine (FLEXERIL) 10 MG tablet Take 0.5-1 tablets (5-10 mg total) by mouth at bedtime as needed for muscle spasms. 30 tablet 1   Diclofenac Sodium 1 % CREA Apply topically in the morning, at noon, and at bedtime.     famotidine (PEPCID) 20 MG tablet Take 20 mg by mouth daily.     FLUoxetine (PROZAC) 20 MG capsule TAKE 1 CAPSULE BY MOUTH DAILY FOR ANXIETY AND DEPRESSION. TAKE WITH 40 MG DOSE. 90 capsule 0   FLUoxetine (PROZAC) 40 MG capsule TAKE 1 CAPSULE EVERY DAY FOR ANXIETY 90 capsule 0   gabapentin (NEURONTIN) 300 MG capsule TAKE 1 CAPSULE TWICE DAILY FOR NERVE PAIN 180 capsule 0   glipiZIDE (GLUCOTROL) 10 MG tablet Take 1 tablet (10 mg total) by mouth 2 (two) times daily before a meal. for diabetes. 180 tablet 0   insulin NPH-regular Human (NOVOLIN 70/30) (70-30) 100 UNIT/ML injection Inject 40 units in the morning and 35 units at night for diabetes. 60 mL 0   Insulin Syringe-Needle U-100 (INSULIN SYRINGE 1CC/30GX5/16") 30G X 5/16" 1 ML MISC Inject twice daily with insulin. 200 each 3   lisinopril (ZESTRIL) 10 MG tablet TAKE 1 TABLET EVERY DAY FOR BLOOD PRESSURE 90 tablet 2   meloxicam (MOBIC)  7.5 MG tablet Take 1 tablet (7.5 mg total) by mouth in the morning and at bedtime. As needed for back pain. 180 tablet 0   metFORMIN (GLUCOPHAGE) 1000 MG tablet TAKE 1 TABLET  BY MOUTH 2 (TWO) TIMES DAILY WITH A MEAL FOR DIABETES. 180 tablet 0   mupirocin ointment (BACTROBAN) 2 % APPLY 1 APPLICATION TOPICALLY 2 (TWO) TIMES DAILY. 22 g 0   traZODone (DESYREL) 50 MG tablet TAKE 1/2 TO 1 TABLET  BY MOUTH AT BEDTIME AS NEEDED FOR SLEEP. 90 tablet 0   No current facility-administered medications on file prior to visit.    BP 138/80   Pulse 80   Temp 98 F (36.7 C) (Temporal)   Ht 6' (1.829 m)   Annell Greening Marland Kitchen)  311 lb (141.1 kg)   SpO2 98%   BMI 42.18 kg/m  Objective:   Physical Exam HENT:     Right Ear: Tympanic membrane and ear canal normal.     Left Ear: Tympanic membrane and ear canal normal.     Nose: Nose normal.     Right Sinus: No maxillary sinus tenderness or frontal sinus tenderness.     Left Sinus: No maxillary sinus tenderness or frontal sinus tenderness.  Eyes:     Conjunctiva/sclera: Conjunctivae normal.  Neck:     Thyroid: No thyromegaly.     Vascular: No carotid bruit.  Cardiovascular:     Rate and Rhythm: Normal rate and regular rhythm.     Heart sounds: Normal heart sounds.  Pulmonary:     Effort: Pulmonary effort is normal.     Breath sounds: Normal breath sounds. No wheezing or rales.  Abdominal:     General: Bowel sounds are normal.     Palpations: Abdomen is soft.     Tenderness: There is no abdominal tenderness.  Musculoskeletal:        General: Normal range of motion.     Cervical back: Neck supple.  Skin:    General: Skin is warm and dry.  Neurological:     Mental Status: He is alert and oriented to person, place, and time.     Cranial Nerves: No cranial nerve deficit.     Deep Tendon Reflexes: Reflexes are normal and symmetric.  Psychiatric:        Mood and Affect: Mood normal.           Assessment & Plan:  Encounter for well adult exam with  abnormal findings Assessment & Plan: Immunizations UTD. Colonoscopy overdue, declines today. Also declines Cologuard. PSA due and pending.  Discussed the importance of a healthy diet and regular exercise in order for weight loss, and to reduce the risk of further co-morbidity.  Exam stable. Labs pending.  Follow up in 1 year for repeat physical.    Essential hypertension Assessment & Plan: Overall controlled.  Continue lisinopril 10 mg daily.  I've asked for him to start checking BP at home and send readings in 1-2 weeks.  Consider increasing dose of lisinopril to 20 mg daily.  CMP pending.  Orders: -     Comprehensive metabolic panel  Gastroesophageal reflux disease, unspecified whether esophagitis present Assessment & Plan: Controlled.  Continue famotidine 20 mg daily.   Diabetic mononeuropathy associated with type 2 diabetes mellitus ([place]) Assessment & Plan: Overall controlled.  Continue 300 mg BID. Continue to work on diabetes control.    Anxiety and depression Assessment & Plan: Overall controlled.  Continue fluoxetine 60 mg daily. Continue Trazodone 25-50 mg HS PRN.     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  Continue injections.    Chronic right-sided low back pain without sciatica Assessment & Plan: Overall controlled.  Continue meloxicam 7.5 mg as needed and cyclobenzaprine 10 mg PRN. Continue gabapentin 300 mg BID.   Hyperlipidemia, unspecified hyperlipidemia type Assessment & Plan: Repeat lipid panel pending.  Discussed the importance of a healthy diet and regular exercise in order for weight loss, and to reduce the risk of further co-morbidity. Continue atorvastatin 20 mg daily.  Orders: -     Lipid panel  Type 2 diabetes mellitus with hyperglycemia, with long-term current use of insulin  ([place]) Assessment & Plan:  Repeat A1C pending.  Continue metformin 1000 mg BID and glipizide 10 mg BID.  Continue Novolin 70/30, 40 units in AM and 35 units in PM.  Follow up in 3-6 months based on A1C result.   Consider Jardiance vs Trulicity.   Goal A1C is <7.  Orders: -     Microalbumin / creatinine urine ratio -     Hemoglobin A1c  Screening for prostate cancer -     PSA, Medicare  Urinary incontinence, unspecified type Assessment & Plan: Checking labs today. Consider oxybutynin vs tamsulosin.           Pleas Koch, NP

## 2022-06-08 NOTE — Assessment & Plan Note (Signed)
Overall controlled.  Continue 300 mg BID. Continue to work on diabetes control.

## 2022-06-08 NOTE — Assessment & Plan Note (Signed)
Controlled. ? ?Continue famotidine 20 mg daily.  ?

## 2022-06-08 NOTE — Assessment & Plan Note (Signed)
Repeat lipid panel pending.  Discussed the importance of a healthy diet and regular exercise in order for weight loss, and to reduce the risk of further co-morbidity. Continue atorvastatin 20 mg daily.

## 2022-06-15 DIAGNOSIS — Z961 Presence of intraocular lens: Secondary | ICD-10-CM | POA: Diagnosis not present

## 2022-06-15 DIAGNOSIS — E113213 Type 2 diabetes mellitus with mild nonproliferative diabetic retinopathy with macular edema, bilateral: Secondary | ICD-10-CM | POA: Diagnosis not present

## 2022-06-15 DIAGNOSIS — E113211 Type 2 diabetes mellitus with mild nonproliferative diabetic retinopathy with macular edema, right eye: Secondary | ICD-10-CM | POA: Diagnosis not present

## 2022-06-15 DIAGNOSIS — E113212 Type 2 diabetes mellitus with mild nonproliferative diabetic retinopathy with macular edema, left eye: Secondary | ICD-10-CM | POA: Diagnosis not present

## 2022-06-22 ENCOUNTER — Telehealth: Payer: Medicare HMO | Admitting: Nurse Practitioner

## 2022-06-22 DIAGNOSIS — R32 Unspecified urinary incontinence: Secondary | ICD-10-CM

## 2022-06-22 DIAGNOSIS — J40 Bronchitis, not specified as acute or chronic: Secondary | ICD-10-CM

## 2022-06-22 DIAGNOSIS — E1165 Type 2 diabetes mellitus with hyperglycemia: Secondary | ICD-10-CM

## 2022-06-22 MED ORDER — AZITHROMYCIN 250 MG PO TABS: ORAL_TABLET | ORAL | 0 refills | Status: AC

## 2022-06-22 MED ORDER — BENZONATATE 100 MG PO CAPS: 100.0000 mg | ORAL_CAPSULE | Freq: Three times a day (TID) | ORAL | 0 refills | Status: DC | PRN

## 2022-06-22 MED ORDER — TRULICITY 0.75 MG/0.5ML ~~LOC~~ SOAJ: 0.7500 mg | SUBCUTANEOUS | 0 refills | Status: DC

## 2022-06-22 NOTE — Progress Notes (Signed)
E-Visit for Cough  We are sorry that you are not feeling well.  Here is how we plan to help!  Based on your presentation I believe you most likely have A cough due to bacteria.  When patients have a fever and a productive cough with a change in color or increased sputum production, we are concerned about bacterial bronchitis.  If left untreated it can progress to pneumonia.  If your symptoms do not improve with your treatment plan it is important that you contact your provider.   I have prescribed Azithromyin 250 mg: two tablets now and then one tablet daily for 4 additonal days    In addition you may use A prescription cough medication called Tessalon Perles 100mg. You may take 1-2 capsules every 8 hours as needed for your cough.   From your responses in the eVisit questionnaire you describe inflammation in the upper respiratory tract which is causing a significant cough.  This is commonly called Bronchitis and has four common causes:   Allergies Viral Infections Acid Reflux Bacterial Infection Allergies, viruses and acid reflux are treated by controlling symptoms or eliminating the cause. An example might be a cough caused by taking certain blood pressure medications. You stop the cough by changing the medication. Another example might be a cough caused by acid reflux. Controlling the reflux helps control the cough.  USE OF BRONCHODILATOR ("RESCUE") INHALERS: There is a risk from using your bronchodilator too frequently.  The risk is that over-reliance on a medication which only relaxes the muscles surrounding the breathing tubes can reduce the effectiveness of medications prescribed to reduce swelling and congestion of the tubes themselves.  Although you feel brief relief from the bronchodilator inhaler, your asthma may actually be worsening with the tubes becoming more swollen and filled with mucus.  This can delay other crucial treatments, such as oral steroid medications. If you need to use a  bronchodilator inhaler daily, several times per day, you should discuss this with your provider.  There are probably better treatments that could be used to keep your asthma under control.     HOME CARE Only take medications as instructed by your medical team. Complete the entire course of an antibiotic. Drink plenty of fluids and get plenty of rest. Avoid close contacts especially the very young and the elderly Cover your mouth if you cough or cough into your sleeve. Always remember to wash your hands A steam or ultrasonic humidifier can help congestion.   GET HELP RIGHT AWAY IF: You develop worsening fever. You become short of breath You cough up blood. Your symptoms persist after you have completed your treatment plan MAKE SURE YOU  Understand these instructions. Will watch your condition. Will get help right away if you are not doing well or get worse.    Thank you for choosing an e-visit.  Your e-visit answers were reviewed by a board certified advanced clinical practitioner to complete your personal care plan. Depending upon the condition, your plan could have included both over the counter or prescription medications.  Please review your pharmacy choice. Make sure the pharmacy is open so you can pick up prescription now. If there is a problem, you may contact your provider through MyChart messaging and have the prescription routed to another pharmacy.  Your safety is important to us. If you have drug allergies check your prescription carefully.   For the next 24 hours you can use MyChart to ask questions about today's visit, request a non-urgent   call back, or ask for a work or school excuse. You will get an email in the next two days asking about your experience. I hope that your e-visit has been valuable and will speed your recovery.   Meds ordered this encounter  Medications   azithromycin (ZITHROMAX) 250 MG tablet    Sig: Take 2 tablets on day 1, then 1 tablet daily on  days 2 through 5    Dispense:  6 tablet    Refill:  0   benzonatate (TESSALON) 100 MG capsule    Sig: Take 1 capsule (100 mg total) by mouth 3 (three) times daily as needed.    Dispense:  30 capsule    Refill:  0    I spent approximately 5 minutes reviewing the patient's history, current symptoms and coordinating their care today.   

## 2022-07-01 MED ORDER — TAMSULOSIN HCL 0.4 MG PO CAPS: 0.4000 mg | ORAL_CAPSULE | Freq: Every day | ORAL | 0 refills | Status: DC

## 2022-07-20 DIAGNOSIS — E113211 Type 2 diabetes mellitus with mild nonproliferative diabetic retinopathy with macular edema, right eye: Secondary | ICD-10-CM | POA: Diagnosis not present

## 2022-07-20 DIAGNOSIS — Z961 Presence of intraocular lens: Secondary | ICD-10-CM | POA: Diagnosis not present

## 2022-07-20 DIAGNOSIS — E113212 Type 2 diabetes mellitus with mild nonproliferative diabetic retinopathy with macular edema, left eye: Secondary | ICD-10-CM | POA: Diagnosis not present

## 2022-07-20 DIAGNOSIS — E113213 Type 2 diabetes mellitus with mild nonproliferative diabetic retinopathy with macular edema, bilateral: Secondary | ICD-10-CM | POA: Diagnosis not present

## 2022-07-24 ENCOUNTER — Other Ambulatory Visit: Payer: Self-pay | Admitting: Primary Care

## 2022-07-24 DIAGNOSIS — E1141 Type 2 diabetes mellitus with diabetic mononeuropathy: Secondary | ICD-10-CM

## 2022-07-24 DIAGNOSIS — E119 Type 2 diabetes mellitus without complications: Secondary | ICD-10-CM

## 2022-08-19 ENCOUNTER — Other Ambulatory Visit: Payer: Self-pay | Admitting: Primary Care

## 2022-08-19 DIAGNOSIS — F32A Depression, unspecified: Secondary | ICD-10-CM

## 2022-08-19 DIAGNOSIS — E119 Type 2 diabetes mellitus without complications: Secondary | ICD-10-CM

## 2022-08-24 DIAGNOSIS — E113212 Type 2 diabetes mellitus with mild nonproliferative diabetic retinopathy with macular edema, left eye: Secondary | ICD-10-CM | POA: Diagnosis not present

## 2022-08-24 DIAGNOSIS — E113213 Type 2 diabetes mellitus with mild nonproliferative diabetic retinopathy with macular edema, bilateral: Secondary | ICD-10-CM | POA: Diagnosis not present

## 2022-08-24 DIAGNOSIS — E113211 Type 2 diabetes mellitus with mild nonproliferative diabetic retinopathy with macular edema, right eye: Secondary | ICD-10-CM | POA: Diagnosis not present

## 2022-09-28 DIAGNOSIS — E113211 Type 2 diabetes mellitus with mild nonproliferative diabetic retinopathy with macular edema, right eye: Secondary | ICD-10-CM | POA: Diagnosis not present

## 2022-09-28 DIAGNOSIS — E113213 Type 2 diabetes mellitus with mild nonproliferative diabetic retinopathy with macular edema, bilateral: Secondary | ICD-10-CM | POA: Diagnosis not present

## 2022-09-28 DIAGNOSIS — E113212 Type 2 diabetes mellitus with mild nonproliferative diabetic retinopathy with macular edema, left eye: Secondary | ICD-10-CM | POA: Diagnosis not present

## 2022-11-08 DIAGNOSIS — E113213 Type 2 diabetes mellitus with mild nonproliferative diabetic retinopathy with macular edema, bilateral: Secondary | ICD-10-CM | POA: Diagnosis not present

## 2022-11-08 DIAGNOSIS — E113211 Type 2 diabetes mellitus with mild nonproliferative diabetic retinopathy with macular edema, right eye: Secondary | ICD-10-CM | POA: Diagnosis not present

## 2022-11-08 DIAGNOSIS — E113212 Type 2 diabetes mellitus with mild nonproliferative diabetic retinopathy with macular edema, left eye: Secondary | ICD-10-CM | POA: Diagnosis not present

## 2022-11-10 DIAGNOSIS — R32 Unspecified urinary incontinence: Secondary | ICD-10-CM

## 2022-11-10 DIAGNOSIS — E1165 Type 2 diabetes mellitus with hyperglycemia: Secondary | ICD-10-CM

## 2022-11-11 ENCOUNTER — Other Ambulatory Visit: Payer: Self-pay | Admitting: Primary Care

## 2022-11-11 DIAGNOSIS — E1140 Type 2 diabetes mellitus with diabetic neuropathy, unspecified: Secondary | ICD-10-CM

## 2022-11-11 DIAGNOSIS — Z794 Long term (current) use of insulin: Secondary | ICD-10-CM

## 2022-11-11 MED ORDER — NOVOLIN 70/30 (70-30) 100 UNIT/ML ~~LOC~~ SUSP
SUBCUTANEOUS | 0 refills | Status: DC
Start: 2022-11-11 — End: 2023-01-25

## 2022-11-11 MED ORDER — TRULICITY 0.75 MG/0.5ML ~~LOC~~ SOAJ
0.7500 mg | SUBCUTANEOUS | 0 refills | Status: DC
Start: 2022-11-11 — End: 2022-11-18

## 2022-11-11 NOTE — Telephone Encounter (Signed)
Can we verify that he has stopped taking the flomax and see if his symptoms have resolved since taking it. Also was he taking it before bed or in the morning time?

## 2022-11-12 NOTE — Telephone Encounter (Signed)
Called and spoke with patient, he has stopped taking the Flomax and since then all of his symptoms have resolved including the lightheadedness. He was taking the medication right before going to bed. He understands Jae Dire is out of the office and will await a response if there are other options.

## 2022-11-18 MED ORDER — TADALAFIL 5 MG PO TABS
5.0000 mg | ORAL_TABLET | Freq: Every day | ORAL | 1 refills | Status: DC
Start: 2022-11-18 — End: 2023-06-01

## 2022-11-18 MED ORDER — TRULICITY 0.75 MG/0.5ML ~~LOC~~ SOAJ
0.7500 mg | SUBCUTANEOUS | 5 refills | Status: DC
Start: 2022-11-18 — End: 2023-06-01

## 2022-11-25 ENCOUNTER — Telehealth: Payer: Self-pay

## 2022-11-25 NOTE — Telephone Encounter (Signed)
Received below message from [facility] regarding patients tadalafil

## 2022-11-25 NOTE — Telephone Encounter (Signed)
Please call patient:  Let him know that the tadalafil (cialis) is not covered by the insurance.  I have one option left which is a medication called finasteride. This is taken once daily. It may take months for this medication to start working, just [employer].   Does he want to try? The other option is to send him to Urology.  Let me know.

## 2022-11-25 NOTE — Telephone Encounter (Signed)
Unable to reach patient. Left voicemail to return call to our office.   

## 2022-11-26 NOTE — Telephone Encounter (Signed)
Called and spoke with patients wife, per dpr. Advised of Isabella Stalling message, she will check with patient and let us know what he would like to do.

## 2022-11-28 ENCOUNTER — Other Ambulatory Visit: Payer: Self-pay | Admitting: Primary Care

## 2022-11-28 DIAGNOSIS — E785 Hyperlipidemia, unspecified: Secondary | ICD-10-CM

## 2022-11-28 DIAGNOSIS — I10 Essential (primary) hypertension: Secondary | ICD-10-CM

## 2022-11-28 DIAGNOSIS — E119 Type 2 diabetes mellitus without complications: Secondary | ICD-10-CM

## 2022-11-29 ENCOUNTER — Telehealth: Payer: Self-pay | Admitting: Primary Care

## 2022-11-29 NOTE — Telephone Encounter (Signed)
Please see other telephone note, waiting on patient to contact office if he would like to try another medication or urology referral.

## 2022-11-29 NOTE — Telephone Encounter (Signed)
Delisa from [facility] called in and was requesting an alternative for tadalafil (CIALIS) 5 MG tablet. She was requesting either Finasteride 5 MG tablet or Dutasteride 0.5 MG capsules. Thank you!

## 2022-11-30 NOTE — Telephone Encounter (Signed)
Called and left message with patient requesting he give the office a call back to advise which option he would like. A new medication or urology referral.

## 2022-11-30 NOTE — Telephone Encounter (Signed)
Monica from [facility] called in to follow up on this request. Informed her we are waiting to hear from patient.

## 2022-12-01 NOTE — Telephone Encounter (Signed)
Pt called back returning Scott Rice's call. Pt states he wants to wait on things due to his wife currently going through radiation. Pt states he'll wait & discuss things over with Clark during next visit on 12/14/22.

## 2022-12-01 NOTE — Telephone Encounter (Signed)
Pt called back returning Peytyn Trine's call. Pt states he wants to wait on things due to his wife currently going through radiation. Pt states he'll wait & discuss things over with Clark during next visit on 12/14/22.

## 2022-12-01 NOTE — Telephone Encounter (Signed)
Noted  

## 2022-12-13 DIAGNOSIS — E113211 Type 2 diabetes mellitus with mild nonproliferative diabetic retinopathy with macular edema, right eye: Secondary | ICD-10-CM | POA: Diagnosis not present

## 2022-12-13 DIAGNOSIS — E113212 Type 2 diabetes mellitus with mild nonproliferative diabetic retinopathy with macular edema, left eye: Secondary | ICD-10-CM | POA: Diagnosis not present

## 2022-12-14 ENCOUNTER — Ambulatory Visit: Payer: Medicare HMO | Admitting: Primary Care

## 2022-12-15 ENCOUNTER — Encounter: Payer: Self-pay | Admitting: Primary Care

## 2022-12-30 ENCOUNTER — Encounter: Payer: Self-pay | Admitting: Primary Care

## 2022-12-30 ENCOUNTER — Ambulatory Visit: Payer: Medicare HMO | Admitting: Primary Care

## 2022-12-30 VITALS — BP 132/82 | HR 79 | Temp 97.6°F | Ht 72.0 in | Wt 319.0 lb

## 2022-12-30 DIAGNOSIS — E1165 Type 2 diabetes mellitus with hyperglycemia: Secondary | ICD-10-CM

## 2022-12-30 DIAGNOSIS — Z794 Long term (current) use of insulin: Secondary | ICD-10-CM | POA: Diagnosis not present

## 2022-12-30 DIAGNOSIS — R32 Unspecified urinary incontinence: Secondary | ICD-10-CM | POA: Diagnosis not present

## 2022-12-30 DIAGNOSIS — Z23 Encounter for immunization: Secondary | ICD-10-CM

## 2022-12-30 LAB — MICROALBUMIN / CREATININE URINE RATIO
Creatinine,U: 117.7 mg/dL
Microalb Creat Ratio: 0.8 mg/g (ref 0.0–30.0)
Microalb, Ur: 0.9 mg/dL (ref 0.0–1.9)

## 2022-12-30 LAB — POCT GLYCOSYLATED HEMOGLOBIN (HGB A1C): Hemoglobin A1C: 7.5 % — AB (ref 4.0–5.6)

## 2022-12-30 MED ORDER — TAMSULOSIN HCL 0.4 MG PO CAPS
0.4000 mg | ORAL_CAPSULE | Freq: Every day | ORAL | 1 refills | Status: DC
Start: 2022-12-30 — End: 2023-06-01

## 2022-12-30 NOTE — Assessment & Plan Note (Signed)
Resume tamsulosin as this was effective. He will update if he experiences any side effects.

## 2022-12-30 NOTE — Progress Notes (Signed)
Subjective:    Patient ID: Scott Rice, male    DOB: [birth year], 57 y.o.   MRN: [id number]  HPI  Quantavis Obryant is a very pleasant 57 y.o. male with a history of hypertension, type 2 diabetes, diabetic neuropathy, diabetic retinopathy, hyperlipidemia, lower extremity pain, chronic back pain who presents today for follow-up of diabetes.  He would also like to resume his Flomax.  Current medications include: Trulicity 0.75 mg weekly, metformin 1000 mg twice daily, glipizide 10 mg twice daily, Novolin 70/30 40 units in a.m. and 35 units in p.m.  He is checking his blood glucose 2 times daily and is getting readings of:  AM fasting: 120s, today 176 Before dinner: 140-150s  Highest reading: 180  Last A1C: 7.8 in March 2024, 7.5 today Last Eye Exam: Up-to-date Last Foot Exam: Due Pneumonia Vaccination: 2018 Urine Microalbumin: Due Statin: Atorvastatin  Dietary changes since last visit: Poor diet. Increased intake of candy, junk food, pizza.    Exercise: None.   BP Readings from Last 3 Encounters:  12/30/22 132/82  06/08/22 138/80  06/08/22 138/64     Review of Systems  Eyes:  Positive for visual disturbance.  Respiratory:  Negative for shortness of breath.   Cardiovascular:  Negative for chest pain.  Neurological:  Positive for numbness.         Past Medical History:  Diagnosis Date   Anxiety and depression    Cellulitis 02/24/2021   Cellulitis and abscess of neck 03/02/2021   Diabetic neuropathy (HCC)    Essential hypertension    Hyperlipidemia    Type 2 diabetes mellitus (HCC)     Social History   Socioeconomic History   Marital status: Married    Spouse name: Not on file   Number of children: Not on file   Years of education: Not on file   Highest education level: Bachelor's degree (e.g., BA, AB, BS)  Occupational History   Not on file  Tobacco Use   Smoking status: Former   Smokeless tobacco: Never   Tobacco comments:    quit 40 years  ago  Substance and Sexual Activity   Alcohol use: No   Drug use: No   Sexual activity: Not on file  Other Topics Concern   Not on file  Social History Narrative   Married.   No children.   Works as an Event organiser.   Enjoys watching movies, spending time with family.    Social Determinants of Health   Financial Resource Strain: Medium Risk (12/29/2022)   Overall Financial Resource Strain (CARDIA)    Difficulty of Paying Living Expenses: Somewhat hard  Food Insecurity: Food Insecurity Present (12/29/2022)   Hunger Vital Sign    Worried About Running Out of Food in the Last Year: Sometimes true    Ran Out of Food in the Last Year: Never true  Transportation Needs: No Transportation Needs (12/29/2022)   PRAPARE - Administrator, Civil Service (Medical): No    Lack of Transportation (Non-Medical): No  Physical Activity: Insufficiently Active (12/29/2022)   Exercise Vital Sign    Days of Exercise per Week: 2 days    Minutes of Exercise per Session: 30 min  Stress: Stress Concern Present (12/29/2022)   [employer] of Occupational Health - Occupational Stress Questionnaire    Feeling of Stress : Rather much  Social Connections: Socially Isolated (12/29/2022)   Social Connection and Isolation Panel [NHANES]    Frequency of Communication with Friends and Family: Once  a week    Frequency of Social Gatherings with Friends and Family: Never    Attends Religious Services: Never    Database administrator or Organizations: No    Attends Banker Meetings: Never    Marital Status: Married  Catering manager Violence: Not At Risk (06/08/2022)   Humiliation, Afraid, Rape, and Kick questionnaire    Fear of Current or Ex-Partner: No    Emotionally Abused: No    Physically Abused: No    Sexually Abused: No    No past surgical history on file.  No family history on file.  No Known Allergies  Current Outpatient Medications on File Prior to Visit   Medication Sig Dispense Refill   acetaminophen (TYLENOL) 500 MG tablet Take 1,000 mg by mouth in the morning and at bedtime.     atorvastatin (LIPITOR) 20 MG tablet TAKE 1 TABLET EVERY EVENING FOR CHOLESTEROL 90 tablet 1   B Complex-C (SUPER B COMPLEX PO) Take by mouth 2 (two) times daily.     Calcium Carbonate-Vitamin D (CALCIUM 600/VITAMIN D PO) Take by mouth daily.     cyclobenzaprine (FLEXERIL) 10 MG tablet Take 0.5-1 tablets (5-10 mg total) by mouth at bedtime as needed for muscle spasms. 30 tablet 1   Diclofenac Sodium 1 % CREA Apply topically in the morning, at noon, and at bedtime.     Dulaglutide (TRULICITY) 0.75 MG/0.5ML SOPN Inject 0.75 mg into the skin once a week. for diabetes. 2 mL 5   famotidine (PEPCID) 20 MG tablet Take 20 mg by mouth daily.     FLUoxetine (PROZAC) 20 MG capsule TAKE 1 CAPSULE DAILY FOR ANXIETY AND DEPRESSION. TAKE WITH 40 MG DOSE. 90 capsule 2   FLUoxetine (PROZAC) 40 MG capsule TAKE 1 CAPSULE EVERY DAY FOR ANXIETY 90 capsule 2   gabapentin (NEURONTIN) 300 MG capsule TAKE 1 CAPSULE TWICE DAILY FOR NERVE PAIN 180 capsule 2   glipiZIDE (GLUCOTROL) 10 MG tablet TAKE 1 TABLET TWICE DAILY BEFORE MEALS FOR DIABETES 180 tablet 1   insulin NPH-regular Human (NOVOLIN 70/30) (70-30) 100 UNIT/ML injection Inject 40 units in the morning and 35 units at night for diabetes. 60 mL 0   Insulin Syringe-Needle U-100 (INSULIN SYRINGE 1CC/30GX5/16") 30G X 5/16" 1 ML MISC Inject twice daily with insulin. 200 each 3   lisinopril (ZESTRIL) 10 MG tablet TAKE 1 TABLET EVERY DAY FOR BLOOD PRESSURE 90 tablet 1   meloxicam (MOBIC) 7.5 MG tablet Take 1 tablet (7.5 mg total) by mouth in the morning and at bedtime. As needed for back pain. 180 tablet 0   metFORMIN (GLUCOPHAGE) 1000 MG tablet TAKE 1 TABLET TWICE DAILY WITH MEALS FOR DIABETES 180 tablet 0   mupirocin ointment (BACTROBAN) 2 % APPLY 1 APPLICATION TOPICALLY 2 (TWO) TIMES DAILY. 22 g 0   tadalafil (CIALIS) 5 MG tablet Take 1  tablet (5 mg total) by mouth daily. For urine flow 90 tablet 1   traZODone (DESYREL) 50 MG tablet TAKE 1/2 TO 1 TABLET  BY MOUTH AT BEDTIME AS NEEDED FOR SLEEP. 90 tablet 0   No current facility-administered medications on file prior to visit.    BP 132/82 (BP Location: Left Arm, Patient Position: Sitting, Cuff Size: Large)   Pulse 79   Temp 97.6 F (36.4 C)   Ht 6' (1.829 m)   Wt (!) 319 lb (144.7 kg)   SpO2 98%   BMI 43.26 kg/m  Objective:   Physical Exam Cardiovascular:  Rate and Rhythm: Normal rate and regular rhythm.  Pulmonary:     Effort: Pulmonary effort is normal.     Breath sounds: Normal breath sounds.  Musculoskeletal:     Cervical back: Neck supple.  Skin:    General: Skin is warm and dry.  Neurological:     Mental Status: He is alert and oriented to person, place, and time.  Psychiatric:        Mood and Affect: Mood normal.           Assessment & Plan:  Type 2 diabetes mellitus with hyperglycemia, with long-term current use of insulin (HCC) Assessment & Plan: Slightly improved with A1C of 7.5 today.  Discussed to work on diet, increase activity level.  He could not tolerate higher doses of Trulicity previously.  Continue Trulicity 0.75 mg weekly. Continue metformin 1000 mg BID, glipizide 10 mg BID. Continue Novolog70/30 40 units in AM and 35 units in PM.  He will work on diet.  Foot exam today. Urine microalbumin   Follow up in 6 months.  Orders: -     POCT glycosylated hemoglobin (Hb A1C) -     Microalbumin / creatinine urine ratio  Urinary incontinence, unspecified type Assessment & Plan: Resume tamsulosin as this was effective. He will update if he experiences any side effects.  Orders: -     Tamsulosin HCl; Take 1 capsule (0.4 mg total) by mouth daily. For urinary frequency  Dispense: 90 capsule; Refill: 1        Doreene Nest, NP

## 2022-12-30 NOTE — Patient Instructions (Signed)
You may resume tamsulosin for urinary frequency.  I sent this to [facility].  Stop by the lab before you leave today.  Schedule your physical for 6 months.  It was a pleasure to see you today! Diabetes Mellitus and Nutrition, Adult When you have diabetes, or diabetes mellitus, it is very important to have healthy eating habits because your blood sugar (glucose) levels are greatly affected by what you eat and drink. Eating healthy foods in the right amounts, at about the same times every day, can help you: Manage your blood glucose. Lower your risk of heart disease. Improve your blood pressure. Reach or maintain a healthy weight. What can affect my meal plan? Every person with diabetes is different, and each person has different needs for a meal plan. Your health care provider may recommend that you work with a dietitian to make a meal plan that is best for you. Your meal plan may vary depending on factors such as: The calories you need. The medicines you take. Your weight. Your blood glucose, blood pressure, and cholesterol levels. Your activity level. Other health conditions you have, such as heart or kidney disease. How do carbohydrates affect me? Carbohydrates, also called carbs, affect your blood glucose level more than any other type of food. Eating carbs raises the amount of glucose in your blood. It is important to know how many carbs you can safely have in each meal. This is different for every person. Your dietitian can help you calculate how many carbs you should have at each meal and for each snack. How does alcohol affect me? Alcohol can cause a decrease in blood glucose (hypoglycemia), especially if you use insulin or take certain diabetes medicines by mouth. Hypoglycemia can be a life-threatening condition. Symptoms of hypoglycemia, such as sleepiness, dizziness, and confusion, are similar to symptoms of having too much alcohol. Do not drink alcohol if: Your health  care provider tells you not to drink. You are pregnant, may be pregnant, or are planning to become pregnant. If you drink alcohol: Limit how much you have to: 0-1 drink a day for women. 0-2 drinks a day for men. Know how much alcohol is in your drink. In the [place] one drink equals one 12 oz bottle of beer (355 mL), one 5 oz glass of wine (148 mL), or one 1 oz glass of hard liquor (44 mL). Keep yourself hydrated with water, diet soda, or unsweetened iced tea. Keep in mind that regular soda, juice, and other mixers may contain a lot of sugar and must be counted as carbs. What are tips for following this plan?  Reading food labels Start by checking the serving size on the Nutrition Facts label of packaged foods and drinks. The number of calories and the amount of carbs, fats, and other nutrients listed on the label are based on one serving of the item. Many items contain more than one serving per package. Check the total grams (g) of carbs in one serving. Check the number of grams of saturated fats and trans fats in one serving. Choose foods that have a low amount or none of these fats. Check the number of milligrams (mg) of salt (sodium) in one serving. Most people should limit total sodium intake to less than 2,300 mg per day. Always check the nutrition information of foods labeled as "low-fat" or "nonfat." These foods may be higher in added sugar or refined carbs and should be avoided. Talk to your dietitian to identify your daily  goals for nutrients listed on the label. Shopping Avoid buying canned, pre-made, or processed foods. These foods tend to be high in fat, sodium, and added sugar. Shop around the outside edge of the grocery store. This is where you will most often find fresh fruits and vegetables, bulk grains, fresh meats, and fresh dairy products. Cooking Use low-heat cooking methods, such as baking, instead of high-heat cooking methods, such as deep frying. Cook using healthy oils,  such as olive, canola, or sunflower oil. Avoid cooking with butter, cream, or high-fat meats. Meal planning Eat meals and snacks regularly, preferably at the same times every day. Avoid going long periods of time without eating. Eat foods that are high in fiber, such as fresh fruits, vegetables, beans, and whole grains. Eat 4-6 oz (112-168 g) of lean protein each day, such as lean meat, chicken, fish, eggs, or tofu. One ounce (oz) (28 g) of lean protein is equal to: 1 oz (28 g) of meat, chicken, or fish. 1 egg.  cup (62 g) of tofu. Eat some foods each day that contain healthy fats, such as avocado, nuts, seeds, and fish. What foods should I eat? Fruits Berries. Apples. Oranges. Peaches. Apricots. Plums. Grapes. Mangoes. Papayas. Pomegranates. Kiwi. Cherries. Vegetables Leafy greens, including lettuce, spinach, kale, chard, collard greens, mustard greens, and cabbage. Beets. Cauliflower. Broccoli. Carrots. Green beans. Tomatoes. Peppers. Onions. Cucumbers. Brussels sprouts. Grains Whole grains, such as whole-wheat or whole-grain bread, crackers, tortillas, cereal, and pasta. Unsweetened oatmeal. Quinoa. Brown or wild rice. Meats and other proteins Seafood. Poultry without skin. Lean cuts of poultry and beef. Tofu. Nuts. Seeds. Dairy Low-fat or fat-free dairy products such as milk, yogurt, and cheese. The items listed above may not be a complete list of foods and beverages you can eat and drink. Contact a dietitian for more information. What foods should I avoid? Fruits Fruits canned with syrup. Vegetables Canned vegetables. Frozen vegetables with butter or cream sauce. Grains Refined white flour and flour products such as bread, pasta, snack foods, and cereals. Avoid all processed foods. Meats and other proteins Fatty cuts of meat. Poultry with skin. Breaded or fried meats. Processed meat. Avoid saturated fats. Dairy Full-fat yogurt, cheese, or milk. Beverages Sweetened drinks, such  as soda or iced tea. The items listed above may not be a complete list of foods and beverages you should avoid. Contact a dietitian for more information. Questions to ask a health care provider Do I need to meet with a certified diabetes care and education specialist? Do I need to meet with a dietitian? What number can I call if I have questions? When are the best times to check my blood glucose? Where to find more information: American Diabetes Association: diabetes.org Academy of Nutrition and Dietetics: eatright.[employer] of Diabetes and Digestive and Kidney Diseases: StageSync.si Association of Diabetes Care & Education Specialists: diabeteseducator.org Summary It is important to have healthy eating habits because your blood sugar (glucose) levels are greatly affected by what you eat and drink. It is important to use alcohol carefully. A healthy meal plan will help you manage your blood glucose and lower your risk of heart disease. Your health care provider may recommend that you work with a dietitian to make a meal plan that is best for you. This information is not intended to replace advice given to you by your health care provider. Make sure you discuss any questions you have with your health care provider. Document Revised: 10/03/2019 Document Reviewed: 10/03/2019 Elsevier Patient Education  2024 Elsevier Inc.

## 2022-12-30 NOTE — Assessment & Plan Note (Signed)
Slightly improved with A1C of 7.5 today.  Discussed to work on diet, increase activity level.  He could not tolerate higher doses of Trulicity previously.  Continue Trulicity 0.75 mg weekly. Continue metformin 1000 mg BID, glipizide 10 mg BID. Continue Novolog70/30 40 units in AM and 35 units in PM.  He will work on diet.  Foot exam today. Urine microalbumin   Follow up in 6 months.

## 2023-01-25 ENCOUNTER — Other Ambulatory Visit: Payer: Self-pay | Admitting: Primary Care

## 2023-01-25 DIAGNOSIS — E113211 Type 2 diabetes mellitus with mild nonproliferative diabetic retinopathy with macular edema, right eye: Secondary | ICD-10-CM | POA: Diagnosis not present

## 2023-01-25 DIAGNOSIS — E119 Type 2 diabetes mellitus without complications: Secondary | ICD-10-CM

## 2023-01-25 DIAGNOSIS — E113212 Type 2 diabetes mellitus with mild nonproliferative diabetic retinopathy with macular edema, left eye: Secondary | ICD-10-CM | POA: Diagnosis not present

## 2023-01-25 DIAGNOSIS — Z794 Long term (current) use of insulin: Secondary | ICD-10-CM

## 2023-01-25 DIAGNOSIS — E113213 Type 2 diabetes mellitus with mild nonproliferative diabetic retinopathy with macular edema, bilateral: Secondary | ICD-10-CM | POA: Diagnosis not present

## 2023-02-16 ENCOUNTER — Other Ambulatory Visit: Payer: Self-pay | Admitting: Primary Care

## 2023-02-16 DIAGNOSIS — E1141 Type 2 diabetes mellitus with diabetic mononeuropathy: Secondary | ICD-10-CM

## 2023-02-16 DIAGNOSIS — F32A Depression, unspecified: Secondary | ICD-10-CM

## 2023-03-07 DIAGNOSIS — E113213 Type 2 diabetes mellitus with mild nonproliferative diabetic retinopathy with macular edema, bilateral: Secondary | ICD-10-CM | POA: Diagnosis not present

## 2023-03-07 DIAGNOSIS — E113212 Type 2 diabetes mellitus with mild nonproliferative diabetic retinopathy with macular edema, left eye: Secondary | ICD-10-CM | POA: Diagnosis not present

## 2023-03-07 DIAGNOSIS — E113211 Type 2 diabetes mellitus with mild nonproliferative diabetic retinopathy with macular edema, right eye: Secondary | ICD-10-CM | POA: Diagnosis not present

## 2023-03-28 ENCOUNTER — Other Ambulatory Visit: Payer: Self-pay | Admitting: Primary Care

## 2023-03-28 DIAGNOSIS — E1141 Type 2 diabetes mellitus with diabetic mononeuropathy: Secondary | ICD-10-CM

## 2023-05-27 ENCOUNTER — Telehealth: Payer: Self-pay | Admitting: Primary Care

## 2023-05-27 ENCOUNTER — Ambulatory Visit: Payer: Self-pay | Admitting: Primary Care

## 2023-05-27 NOTE — Telephone Encounter (Signed)
 Called patient to try and get him triaged. He scheduled an appointment for dizziness and needed to get more information. If patient calls back in, he will just need to be triaged for dizziness.

## 2023-05-27 NOTE — Telephone Encounter (Signed)
 Noted will see him as scheduled

## 2023-05-27 NOTE — Telephone Encounter (Signed)
 Copied from CRM [id number]. Topic: Clinical - Red Word Triage >> May 27, 2023  9:49 AM Isabell A wrote: Red Word that prompted transfer to Nurse Triage: Patient feeling dizziness and off, states he has diabetes.   Chief Complaint: Dizziness  Symptoms: Vertigo  Frequency: Intermittent  Pertinent Negatives: Patient denies headache, numbness, tingling Disposition: [] ED /[] Urgent Care (no appt availability in office) / [x] Appointment(In office/virtual)/ []  [facility]/ [] Home Care/ [] Refused Recommended Disposition /[] [facility]/ []  Follow-up with PCP Additional Notes: Patient reports that he has had intermittent dizziness for the last 3-4 months. He states he took Benadryl last night and had some dizziness this morning but is unsure if his dizziness today was due to the Benadryl or not. He denies an other symptoms associated with his dizziness. Appointment made for the patient on 05/31/23.    Reason for Disposition  [1] MILD dizziness (e.g., vertigo; walking normally) AND [2] has NOT been evaluated by doctor (or NP/PA) for this  Answer Assessment - Initial Assessment Questions 1. DESCRIPTION: "Describe your dizziness."     "I'll be looking down and I'll feel like I'm going to fall but I'm not moving" 2. VERTIGO: "Do you feel like either you or the room is spinning or tilting?"      Yes, tilting  3. LIGHTHEADED: "Do you feel lightheaded?" (e.g., somewhat faint, woozy, weak upon standing)     Yes 4. SEVERITY: "How bad is it?"  "Can you walk?"   - MILD: Feels slightly dizzy and unsteady, but is walking normally.   - MODERATE: Feels unsteady when walking, but not falling; interferes with normal activities (e.g., school, work).   - SEVERE: Unable to walk without falling, or requires assistance to walk without falling.     Moderate  5. ONSET:  "When did the dizziness begin?"     3-5 months intermittently 6. AGGRAVATING FACTORS: "Does anything make it worse?" (e.g., standing,  change in head position)     Change in head position, standing up 7. CAUSE: "What do you think is causing the dizziness?"     Unsure 8. RECURRENT SYMPTOM: "Have you had dizziness before?" If Yes, ask: "When was the last time?" "What happened that time?"     Yes 9. OTHER SYMPTOMS: "Do you have any other symptoms?" (e.g., headache, weakness, numbness, vomiting, earache)     Sinus problems related to seasonal allergies  Protocols used: Dizziness - Vertigo-A-AH

## 2023-05-28 NOTE — Telephone Encounter (Signed)
 Noted and appreciate Tabitha, NP evaluation.

## 2023-05-31 ENCOUNTER — Ambulatory Visit: Admitting: Family

## 2023-06-01 ENCOUNTER — Encounter: Payer: Self-pay | Admitting: Primary Care

## 2023-06-01 ENCOUNTER — Ambulatory Visit: Attending: Primary Care

## 2023-06-01 ENCOUNTER — Ambulatory Visit (INDEPENDENT_AMBULATORY_CARE_PROVIDER_SITE_OTHER): Admitting: Primary Care

## 2023-06-01 VITALS — BP 110/68 | HR 94 | Temp 96.8°F | Ht 72.0 in | Wt 321.0 lb

## 2023-06-01 DIAGNOSIS — E1165 Type 2 diabetes mellitus with hyperglycemia: Secondary | ICD-10-CM

## 2023-06-01 DIAGNOSIS — F32A Depression, unspecified: Secondary | ICD-10-CM | POA: Diagnosis not present

## 2023-06-01 DIAGNOSIS — R42 Dizziness and giddiness: Secondary | ICD-10-CM

## 2023-06-01 DIAGNOSIS — Z794 Long term (current) use of insulin: Secondary | ICD-10-CM | POA: Diagnosis not present

## 2023-06-01 DIAGNOSIS — J3089 Other allergic rhinitis: Secondary | ICD-10-CM

## 2023-06-01 DIAGNOSIS — F419 Anxiety disorder, unspecified: Secondary | ICD-10-CM

## 2023-06-01 DIAGNOSIS — R002 Palpitations: Secondary | ICD-10-CM | POA: Insufficient documentation

## 2023-06-01 DIAGNOSIS — R3915 Urgency of urination: Secondary | ICD-10-CM | POA: Diagnosis not present

## 2023-06-01 DIAGNOSIS — K580 Irritable bowel syndrome with diarrhea: Secondary | ICD-10-CM | POA: Diagnosis not present

## 2023-06-01 MED ORDER — LEVOCETIRIZINE DIHYDROCHLORIDE 5 MG PO TABS: 5.0000 mg | ORAL_TABLET | Freq: Every evening | ORAL | 3 refills | Status: AC

## 2023-06-01 MED ORDER — BUSPIRONE HCL 5 MG PO TABS
5.0000 mg | ORAL_TABLET | Freq: Two times a day (BID) | ORAL | 0 refills | Status: DC
Start: 2023-06-01 — End: 2023-09-04

## 2023-06-01 NOTE — Assessment & Plan Note (Addendum)
 Uncontrolled, chronic condition with recurrent symptoms, known food triggers.    Recommended updated colonoscopy and further evaluation of chronic symptoms.  Will refer to gastroenterology for evaluation/management   Discussed avoidance of triggers, limiting caffiene intake  I evaluated patient, was consulted regarding treatment, and agree with assessment and plan per Julaine Fusi, MSN, FNP student.   Mayra Reel, NP-C

## 2023-06-01 NOTE — Assessment & Plan Note (Addendum)
 Etiology unclear with EKG demonstrating: NSR @ 94bpm, PR 154, QT 346, Qtc 405  Differentials: arrhythmia, anxiety, electrolyte imbalance, thyroid disorder, anemia   Labs: CBC, CMET, TSH ordered - results pending 14 day zio monitor ordered for evaluation - will follow with results   Consider echocardiogram if persistent symptoms and zio results normal  I evaluated patient, was consulted regarding treatment, and agree with assessment and plan per Julaine Fusi, MSN, FNP student.   Mayra Reel, NP-C

## 2023-06-01 NOTE — Assessment & Plan Note (Addendum)
 Etiology unknown  Differentials: dehydration, orthostatic hypotension, vertigo, arrhythmia, brain tumor, stroke  No alarm signs on exam or with HPI  Negative orthostatic hypotension per evaluation  Labs ordered: CBC, CMET, TSH - results pending  Head CT without contrast - results pending  ECG completed, see interpretation under palpitations.   Will follow with results  I evaluated patient, was consulted regarding treatment, and agree with assessment and plan per Julaine Fusi, MSN, FNP student.   Mayra Reel, NP-C

## 2023-06-01 NOTE — Progress Notes (Signed)
 Subjective:    Patient ID: Scott Rice, male    DOB: Jan 13, 1966, 58 y.o.   MRN: [id number]  Dizziness Pertinent negatives include no chest pain.    Scott Rice is a very pleasant 58 y.o. male with a history of type 2 diabetes, diabetic retinopathy, diabetic neuropathy, hypertension, hyperlipidemia, chronic back pain who presents today to discuss numerous symptoms.   His dizziness began about 6 months ago, worse over the last 1-2 weeks. His dizziness occurs sporadically with resting, ambulating, closing his eyes. Dizziness lasts form seconds to minutes. He denies dizziness with laying down. He feels like "I'm surfing" when standing during episodes. He denies room spinning sensations.   He does admit to decreased oral intake of water and liquids due to urinary leakage and incontinence and urgency. He stopped taking Flomax and Cialis as they were not effective.  He has never seen urology. He urinates frequently during the day and night. He drinks diet Sun Drop and water due to excessive thirst. He denies difficulty starting a stream.   He has noticed palpitations, intermittently, with and without dizziness. This began about 2 months ago, occurs 1-2 times weekly, occurs with rest and exertion, last for a few second.   Also with 1 year history of hot flashes intermittently, occurring several times weekly. Does not necessarily occur with palpitations or dizziness, does occur with anxiety sometimes.   Other symptoms include "grayish skin color", chronic nasal congestion and post nasal drip (allergy to dogs, has a dog at home). He is managed on Allegra which does not help. He failed Zyrtec in the past.   Also with increased anxiety with panic attacks, depression, difficulty sleeping, mind racing thoughts. This began years ago, worse 2 years ago with the passing of his father. He does not leave his home except to go to work twice weekly due to symptoms. He is compliant to his fluoxetine 60 mg  daily which helps somewhat but not all the time. He took an extra 20 mg dose one day before work with improvement. He is managed on Trazodone 25 mg HS on occasion which causes drowsiness the following day. He is no longer seeing therapy.   He has a history of IBS-D, diagnosed years ago. He was prescribed Viberzi in the past which caused constipation. He underwent a colonoscopy >10 years ago. He does not like to leave his home due to fear of diarrhea episodes in public. His diarrhea occurs mostly occurs with peppers, pasta, mac and cheese.   He denies syncope, visual changes, ear pressure/fullness, unilateral weakness, facial drooping, headaches, chest pain, bloody stools  His mother in law is checking BP at home which is running 120/80s. Glucose readings are in the 120-130 range. Trulicity is too expensive so he never started.   BP Readings from Last 3 Encounters:  06/01/23 110/68  12/30/22 132/82  06/08/22 138/80     Review of Systems  Respiratory:  Negative for shortness of breath.   Cardiovascular:  Positive for palpitations. Negative for chest pain.  Gastrointestinal:  Positive for diarrhea.  Genitourinary:  Positive for frequency and urgency.  Neurological:  Positive for dizziness.  Psychiatric/Behavioral:  Positive for sleep disturbance. The patient is nervous/anxious.          Past Medical History:  Diagnosis Date   Anxiety and depression    Cellulitis 02/24/2021   Cellulitis and abscess of neck 03/02/2021   Diabetic neuropathy (HCC)    Essential hypertension    Hyperlipidemia    Type  2 diabetes mellitus (HCC)     Social History   Socioeconomic History   Marital status: Married    Spouse name: Not on file   Number of children: Not on file   Years of education: Not on file   Highest education level: Bachelor's degree (e.g., BA, AB, BS)  Occupational History   Not on file  Tobacco Use   Smoking status: Former   Smokeless tobacco: Never   Tobacco comments:     quit 40 years ago  Substance and Sexual Activity   Alcohol use: No   Drug use: No   Sexual activity: Not on file  Other Topics Concern   Not on file  Social History Narrative   Married.   No children.   Works as an Event organiser.   Enjoys watching movies, spending time with family.    Social Drivers of Health   Financial Resource Strain: Medium Risk (05/30/2023)   Overall Financial Resource Strain (CARDIA)    Difficulty of Paying Living Expenses: Somewhat hard  Food Insecurity: No Food Insecurity (05/30/2023)   Hunger Vital Sign    Worried About Running Out of Food in the Last Year: Never true    Ran Out of Food in the Last Year: Never true  Transportation Needs: No Transportation Needs (05/30/2023)   PRAPARE - Administrator, Civil Service (Medical): No    Lack of Transportation (Non-Medical): No  Physical Activity: Insufficiently Active (05/30/2023)   Exercise Vital Sign    Days of Exercise per Week: 2 days    Minutes of Exercise per Session: 30 min  Stress: Stress Concern Present (05/30/2023)   [employer] of Occupational Health - Occupational Stress Questionnaire    Feeling of Stress : Very much  Social Connections: Moderately Isolated (05/30/2023)   Social Connection and Isolation Panel [NHANES]    Frequency of Communication with Friends and Family: Twice a week    Frequency of Social Gatherings with Friends and Family: Twice a week    Attends Religious Services: Never    Database administrator or Organizations: No    Attends Banker Meetings: Never    Marital Status: Married  Catering manager Violence: Not At Risk (06/08/2022)   Humiliation, Afraid, Rape, and Kick questionnaire    Fear of Current or Ex-Partner: No    Emotionally Abused: No    Physically Abused: No    Sexually Abused: No    History reviewed. No pertinent surgical history.  History reviewed. No pertinent family history.  No Known Allergies  Current Outpatient  Medications on File Prior to Visit  Medication Sig Dispense Refill   acetaminophen (TYLENOL) 500 MG tablet Take 1,000 mg by mouth in the morning and at bedtime.     atorvastatin (LIPITOR) 20 MG tablet TAKE 1 TABLET EVERY EVENING FOR CHOLESTEROL 90 tablet 1   B Complex-C (SUPER B COMPLEX PO) Take by mouth 2 (two) times daily.     cyclobenzaprine (FLEXERIL) 10 MG tablet Take 0.5-1 tablets (5-10 mg total) by mouth at bedtime as needed for muscle spasms. 30 tablet 1   Diclofenac Sodium 1 % CREA Apply topically in the morning, at noon, and at bedtime.     famotidine (PEPCID) 20 MG tablet Take 20 mg by mouth daily.     FLUoxetine (PROZAC) 20 MG capsule TAKE 1 CAPSULE DAILY FOR ANXIETY AND DEPRESSION. TAKE WITH 40 MG DOSE. 90 capsule 2   FLUoxetine (PROZAC) 40 MG capsule TAKE 1 CAPSULE EVERY  DAY FOR ANXIETY 90 capsule 2   gabapentin (NEURONTIN) 300 MG capsule TAKE 1 CAPSULE TWICE DAILY FOR NERVE PAIN 180 capsule 0   glipiZIDE (GLUCOTROL) 10 MG tablet TAKE 1 TABLET TWICE DAILY BEFORE MEALS FOR DIABETES 180 tablet 1   insulin NPH-regular Human (NOVOLIN 70/30) (70-30) 100 UNIT/ML injection INJECT 40 UNITS IN THE MORNING AND 35 UNITS AT NIGHT FOR DIABETES. 60 mL 1   Insulin Syringe-Needle U-100 (INSULIN SYRINGE 1CC/30GX5/16") 30G X 5/16" 1 ML MISC Inject twice daily with insulin. 200 each 3   lisinopril (ZESTRIL) 10 MG tablet TAKE 1 TABLET EVERY DAY FOR BLOOD PRESSURE 90 tablet 1   meloxicam (MOBIC) 7.5 MG tablet Take 1 tablet (7.5 mg total) by mouth in the morning and at bedtime. As needed for back pain. 180 tablet 0   metFORMIN (GLUCOPHAGE) 1000 MG tablet TAKE 1 TABLET TWICE DAILY WITH MEALS FOR DIABETES 180 tablet 1   mupirocin ointment (BACTROBAN) 2 % APPLY 1 APPLICATION TOPICALLY 2 (TWO) TIMES DAILY. 22 g 0   traZODone (DESYREL) 50 MG tablet TAKE 1/2 TO 1 TABLET  BY MOUTH AT BEDTIME AS NEEDED FOR SLEEP. 90 tablet 0   Calcium Carbonate-Vitamin D (CALCIUM 600/VITAMIN D PO) Take by mouth daily. (Patient  not taking: Reported on 06/01/2023)     No current facility-administered medications on file prior to visit.    BP 110/68   Pulse 94   Temp (!) 96.8 F (36 C) (Temporal)   Ht 6' (1.829 m)   Wt (!) 321 lb (145.6 kg)   SpO2 96%   BMI 43.54 kg/m  Objective:   Physical Exam Eyes:     Extraocular Movements: Extraocular movements intact.  Cardiovascular:     Rate and Rhythm: Normal rate and regular rhythm.  Pulmonary:     Effort: Pulmonary effort is normal.     Breath sounds: Normal breath sounds.  Musculoskeletal:     Cervical back: Neck supple.  Skin:    General: Skin is warm and dry.  Neurological:     Mental Status: He is alert and oriented to person, place, and time.     Cranial Nerves: No cranial nerve deficit.     Coordination: Coordination normal.  Psychiatric:        Mood and Affect: Mood normal.           Assessment & Plan:  Palpitations Assessment & Plan: Etiology unclear with EKG demonstrating: NSR @ 94bpm, PR 154, QT 346, Qtc 405  Differentials: arrhythmia, anxiety, electrolyte imbalance, thyroid disorder, anemia   Labs: CBC, CMET, TSH ordered - results pending 14 day zio monitor ordered for evaluation - will follow with results   Consider echocardiogram if persistent symptoms and zio results normal  I evaluated patient, was consulted regarding treatment, and agree with assessment and plan per Julaine Fusi, MSN, FNP student.   Mayra Reel, NP-C   Orders: -     EKG 12-Lead -     TSH -     Comprehensive metabolic panel -     CBC -     LONG TERM MONITOR (3-14 DAYS); Future -     Hemoglobin A1c  Dizziness Assessment & Plan: Etiology unknown  Differentials: dehydration, orthostatic hypotension, vertigo, arrhythmia, brain tumor, stroke  No alarm signs on exam or with HPI  Negative orthostatic hypotension per evaluation  Labs ordered: CBC, CMET, TSH - results pending  Head CT without contrast - results pending  ECG completed, see interpretation  under palpitations.  Will follow with results  I evaluated patient, was consulted regarding treatment, and agree with assessment and plan per Julaine Fusi, MSN, FNP student.   Mayra Reel, NP-C   Orders: -     EKG 12-Lead -     TSH -     Comprehensive metabolic panel -     CBC -     CT HEAD WO CONTRAST ( ); Future -     Hemoglobin A1c  Environmental and seasonal allergies Assessment & Plan: Uncontrolled on current regimen of allegra over-the-counter, per HPI.   Stop allegra. Start levocetirizine 5mg  once daily in the evening  I evaluated patient, was consulted regarding treatment, and agree with assessment and plan per Julaine Fusi, MSN, FNP student.   Mayra Reel, NP-C   Orders: -     Levocetirizine Dihydrochloride; Take 1 tablet (5 mg total) by mouth every evening. For allergies  Dispense: 90 tablet; Refill: 3 -     Hemoglobin A1c  Urinary urgency Assessment & Plan: Uncontrolled, chronic issue per HPI.   Given his therapeutic failure with tamsulosin and cialis will refer to urology for further evaluation and management. PSA pending.  Orders: -     PSA, Medicare -     Ambulatory referral to Urology -     Hemoglobin A1c  Type 2 diabetes mellitus with hyperglycemia, with long-term current use of insulin (HCC) Assessment & Plan: Glucose levels seem overall controlled.   Hgb A1c ordered, results pending  Will follow up for routine physical next month Continue Novolin 70/30, 40 units in AM and 35 units in PM, metformin ER 1000 mg BID, glipizide 10 mg BID.  I evaluated patient, was consulted regarding treatment, and agree with assessment and plan per Julaine Fusi, MSN, FNP student.   Mayra Reel, NP-C   Orders: -     Hemoglobin A1c  Irritable bowel syndrome with diarrhea Assessment & Plan: Uncontrolled, chronic condition with recurrent symptoms, known food triggers.    Recommended updated colonoscopy and further evaluation of chronic symptoms.  Will refer  to gastroenterology for evaluation/management   Discussed avoidance of triggers, limiting caffiene intake  I evaluated patient, was consulted regarding treatment, and agree with assessment and plan per Julaine Fusi, MSN, FNP student.   Mayra Reel, NP-C   Orders: -     Ambulatory referral to Gastroenterology -     Hemoglobin A1c  Anxiety and depression Assessment & Plan: Uncontrolled on current regimen.  Discussed additional pharmacologic treatment and/or therapy.  Will add buspirone 5mg  twice daily, continue fluoxetine 60mg  daily  Referral to psychology for counseling placed.  Follow up in 1 month.  I evaluated patient, was consulted regarding treatment, and agree with assessment and plan per Julaine Fusi, MSN, FNP student.   Mayra Reel, NP-C   Orders: -     Ambulatory referral to Psychology -     busPIRone HCl; Take 1 tablet (5 mg total) by mouth 2 (two) times daily. For anxiety  Dispense: 180 tablet; Refill: 0 -     Hemoglobin A1c    50 minutes spent face to face with patient, developing assessment and plan, and placing orders. See assessment and plan notes.    Doreene Nest, NP

## 2023-06-01 NOTE — Progress Notes (Signed)
 Acute Office Visit  Subjective:     Patient ID: Scott Rice, male    DOB: 1965/08/05, 58 y.o.   MRN: [id number]  Chief Complaint  Patient presents with   Dizziness    Intermittent dizzy episodes  About 1-2 times a week     HPI Scott "Vinetta Bergamo" is a 58 year old male with history of hypertension, type 2 diabetes mellitus, chronic back pain who presents today to discuss dizziness. He also reports palpitations, anxiety/depression/panic attacks, and irritable bowels.  Dizziness  This has been present for 6 months, worsened over the last 1-2 weeks, occurring 1-2 times weekly with no identifiable triggers, lasting less than one minute in duration. Dizziness occurs while sitting up, walking, when closing eyes, but not when lying down. The room does not appear to be spinning; he feels off balance. He states it "feels like I'm surfing but the ground is not moving". He has not had frank syncope since onset of present symptoms. Denies vision changes. No ear fullness or pressure. Reports possible decreased PO hydration due to urinary urgency. No diet changes. No headaches. No unilateral weakness, facial droop. Does report some confusion.  Home BS readings - 120s-130s fasting Home BP readings - 120s systolic/80s No heart rate readings  Palpitations Occur 1-2 weekly with no triggers or correlation to dizziness. No chest pain or shortness of breath. See home vital signs above.  Anxiety/depression/panic attacks Chronic condition with worsening symptoms over last 2 years, when father and dog passed away. He reports mind-racing thoughts and trouble sleeping. He sleeps about 5-6 hours nightly. He takes fluoxetine 60mg  daily and this is intermittently effective. He takes trazadone 25mg  1-2 times weekly as needed - effective for sleep however notes grogginess and fatigue subsequent day. He reports having panic attacks at work and took extra 20mg  fluoxetine once on a workday and felt well. He has anxiety  about leaving home, eating out at restaurants. Attended therapy, last in 2020, with improvement in symptoms.   Irritable bowel syndrome Reports diagnosed by GI MD who has since retired. Was previous taking Viberzi with relief of IBS-D and some symptoms of constipation. No notes for review. Trigger foods: onions, peppers, tomato-based products, mac & cheese. He will not eat out in restaurants due to concern over having to use bathroom. Last colonoscopy, date unknown.   Urinary urgency, urge incontinence  Reports urinary urgency, with intermittent incontinence. No difficulty starting stream. Has taken cialis and tamsulosin in the past with no complete resolution of symptoms. Stopped tamsulosin last month. Has never been evaluated by urology.  Hot flashes Occur several times weekly over the last year. Not correlated to dizziness or palpitations  Environmental and seasonal allergies Reports daily nasal congestion and seasonal allergies, with post-nasal drip. Currently taking allegra OTC. He reports allergies to ragweed and dogs. He has a [place].  Review of Systems  Constitutional:  Positive for diaphoresis. Negative for chills, fever, malaise/fatigue and weight loss.       Hot flashes  HENT:  Positive for congestion. Negative for ear pain.        Ear fullness  Eyes:  Negative for blurred vision, double vision and photophobia.  Respiratory:  Negative for shortness of breath.   Cardiovascular:  Positive for palpitations. Negative for chest pain.  Gastrointestinal:  Positive for diarrhea. Negative for blood in stool and constipation.  Genitourinary:  Positive for urgency.  Skin:  Negative for rash.  Neurological:  Positive for dizziness. Negative for sensory change, speech change, loss of  consciousness, weakness and headaches.  Endo/Heme/Allergies:  Positive for environmental allergies.  Psychiatric/Behavioral:  Positive for depression. The patient is nervous/anxious.          Objective:    BP 110/68   Pulse 94   Temp (!) 96.8 F (36 C) (Temporal)   Ht 6' (1.829 m)   Wt (!) 145.6 kg   SpO2 96%   BMI 43.54 kg/m    Physical Exam Constitutional:      General: He is not in acute distress.    Appearance: Normal appearance.  HENT:     Head: Normocephalic.     Right Ear: Tympanic membrane normal.     Left Ear: Tympanic membrane normal.  Eyes:     Conjunctiva/sclera: Conjunctivae normal.     Pupils: Pupils are equal, round, and reactive to light.  Cardiovascular:     Rate and Rhythm: Normal rate and regular rhythm.     Pulses: Normal pulses.     Heart sounds: Normal heart sounds.  Pulmonary:     Effort: Pulmonary effort is normal.     Breath sounds: Normal breath sounds.  Skin:    General: Skin is warm and dry.  Neurological:     General: No focal deficit present.     Mental Status: He is alert and oriented to person, place, and time.     Sensory: No sensory deficit.     Motor: No weakness.     Coordination: Coordination normal. Finger-Nose-Finger Test normal.     Gait: Gait normal.  Psychiatric:        Attention and Perception: Attention normal.        Mood and Affect: Mood is anxious.        Speech: Speech normal.        Behavior: Behavior normal. Behavior is cooperative.        Thought Content: Thought content normal.        Judgment: Judgment normal.     No results found for any visits on 06/01/23.      Assessment & Plan:   Problem List Items Addressed This Visit       Digestive   Irritable bowel syndrome with diarrhea   Uncontrolled, chronic condition with recurrent symptoms, known food triggers.    Recommended updated colonoscopy and further evaluation of chronic symptoms.  Will refer to gastroenterology for evaluation/management   Discussed avoidance of triggers, limiting caffiene intake  I evaluated patient, was consulted regarding treatment, and agree with assessment and plan per Julaine Fusi, MSN, FNP student.    Mayra Reel, NP-C       Relevant Orders   Ambulatory referral to Gastroenterology   Hemoglobin A1c     Endocrine   Type 2 diabetes mellitus with hyperglycemia (HCC)   Glucose levels seem overall controlled.   Hgb A1c ordered, results pending  Will follow up for routine physical next month Continue Novolin 70/30, 40 units in AM and 35 units in PM, metformin ER 1000 mg BID, glipizide 10 mg BID.  I evaluated patient, was consulted regarding treatment, and agree with assessment and plan per Julaine Fusi, MSN, FNP student.   Mayra Reel, NP-C       Relevant Orders   Hemoglobin A1c     Other   Anxiety and depression   Uncontrolled on current regimen.  Discussed additional pharmacologic treatment and/or therapy.  Will add buspirone 5mg  twice daily, continue fluoxetine 60mg  daily  Referral to psychology for counseling placed.  Follow up in 1  month.  I evaluated patient, was consulted regarding treatment, and agree with assessment and plan per Julaine Fusi, MSN, FNP student.   Mayra Reel, NP-C       Relevant Medications   busPIRone (BUSPAR) 5 MG tablet   Other Relevant Orders   Ambulatory referral to Psychology   Hemoglobin A1c   Palpitations - Primary   Etiology unclear with EKG demonstrating: NSR @ 94bpm, PR 154, QT 346, Qtc 405  Differentials: arrhythmia, anxiety, electrolyte imbalance, thyroid disorder, anemia   Labs: CBC, CMET, TSH ordered - results pending 14 day zio monitor ordered for evaluation - will follow with results   Consider echocardiogram if persistent symptoms and zio results normal  I evaluated patient, was consulted regarding treatment, and agree with assessment and plan per Julaine Fusi, MSN, FNP student.   Mayra Reel, NP-C       Relevant Orders   EKG 12-Lead (Completed)   TSH   Comprehensive metabolic panel   CBC   LONG TERM MONITOR (3-14 DAYS)   Hemoglobin A1c   Dizziness   Etiology unknown  Differentials: dehydration,  orthostatic hypotension, vertigo, arrhythmia, brain tumor, stroke  No alarm signs on exam or with HPI  Negative orthostatic hypotension per evaluation  Labs ordered: CBC, CMET, TSH - results pending  Head CT without contrast - results pending  ECG completed, see interpretation under palpitations.   Will follow with results  I evaluated patient, was consulted regarding treatment, and agree with assessment and plan per Julaine Fusi, MSN, FNP student.   Mayra Reel, NP-C       Relevant Orders   EKG 12-Lead (Completed)   TSH   Comprehensive metabolic panel   CBC   CT HEAD WO CONTRAST ( )   Hemoglobin A1c   Environmental and seasonal allergies   Uncontrolled on current regimen of allegra over-the-counter, per HPI.   Stop allegra. Start levocetirizine 5mg  once daily in the evening  I evaluated patient, was consulted regarding treatment, and agree with assessment and plan per Julaine Fusi, MSN, FNP student.   Mayra Reel, NP-C       Relevant Medications   levocetirizine (XYZAL) 5 MG tablet   Other Relevant Orders   Hemoglobin A1c   Urinary urgency   Uncontrolled, chronic issue per HPI.   Therapeutic failure with tamsulosin and cialis   Will refer to urology for further evaluation and management       Relevant Orders   PSA, Medicare   Ambulatory referral to Urology   Hemoglobin A1c    Meds ordered this encounter  Medications   levocetirizine (XYZAL) 5 MG tablet    Sig: Take 1 tablet (5 mg total) by mouth every evening. For allergies    Dispense:  90 tablet    Refill:  3    Supervising Provider:   BEDSOLE, AMY E [[id number]]   busPIRone (BUSPAR) 5 MG tablet    Sig: Take 1 tablet (5 mg total) by mouth 2 (two) times daily. For anxiety    Dispense:  180 tablet    Refill:  0    No follow-ups on file.  Lindell Spar, RN

## 2023-06-01 NOTE — Assessment & Plan Note (Addendum)
 Uncontrolled on current regimen of allegra over-the-counter, per HPI.   Stop allegra. Start levocetirizine 5mg  once daily in the evening  I evaluated patient, was consulted regarding treatment, and agree with assessment and plan per Julaine Fusi, MSN, FNP student.   Mayra Reel, NP-C

## 2023-06-01 NOTE — Assessment & Plan Note (Addendum)
 Glucose levels seem overall controlled.   Hgb A1c ordered, results pending  Will follow up for routine physical next month Continue Novolin 70/30, 40 units in AM and 35 units in PM, metformin ER 1000 mg BID, glipizide 10 mg BID.  I evaluated patient, was consulted regarding treatment, and agree with assessment and plan per Julaine Fusi, MSN, FNP student.   Mayra Reel, NP-C

## 2023-06-01 NOTE — Assessment & Plan Note (Addendum)
 Uncontrolled, chronic issue per HPI.   Given his therapeutic failure with tamsulosin and cialis will refer to urology for further evaluation and management. PSA pending.

## 2023-06-01 NOTE — Assessment & Plan Note (Addendum)
 Uncontrolled on current regimen.  Discussed additional pharmacologic treatment and/or therapy.  Will add buspirone 5mg  twice daily, continue fluoxetine 60mg  daily  Referral to psychology for counseling placed.  Follow up in 1 month.  I evaluated patient, was consulted regarding treatment, and agree with assessment and plan per Julaine Fusi, MSN, FNP student.   Mayra Reel, NP-C

## 2023-06-01 NOTE — Patient Instructions (Addendum)
 Medications: START buspirone 5mg  twice daily (in addition to fluoxetine) for anxiety   START levocetirizine daily for allergies. STOP allegra  Labs:  CBC, CMET, A1c, TSH, PSA today   Testing:  Head [facility] at [facility] ** you will get a call or message in MyChart to schedule  14 day monitor - will be mailed to home - to monitor heart rate/rhythm  Follow up:  As scheduled  Referrals: Gastroenterology  Urology Psychology/Therapy  ** you will get a letter in MyChart about arranging appointments for these

## 2023-06-02 ENCOUNTER — Encounter: Payer: Self-pay | Admitting: *Deleted

## 2023-06-02 LAB — CBC
HCT: 42.7 % (ref 39.0–52.0)
Hemoglobin: 14.1 g/dL (ref 13.0–17.0)
MCHC: 33 g/dL (ref 30.0–36.0)
MCV: 87.3 fl (ref 78.0–100.0)
Platelets: 264 10*3/uL (ref 150.0–400.0)
RBC: 4.89 Mil/uL (ref 4.22–5.81)
RDW: 15 % (ref 11.5–15.5)
WBC: 7.8 10*3/uL (ref 4.0–10.5)

## 2023-06-02 LAB — COMPREHENSIVE METABOLIC PANEL
ALT: 38 U/L (ref 0–53)
AST: 39 U/L — ABNORMAL HIGH (ref 0–37)
Albumin: 4.6 g/dL (ref 3.5–5.2)
Alkaline Phosphatase: 63 U/L (ref 39–117)
BUN: 19 mg/dL (ref 6–23)
CO2: 24 meq/L (ref 19–32)
Calcium: 9.3 mg/dL (ref 8.4–10.5)
Chloride: 101 meq/L (ref 96–112)
Creatinine, Ser: 0.97 mg/dL (ref 0.40–1.50)
GFR: 86.06 mL/min (ref >60.00)
Glucose, Bld: 211 mg/dL — ABNORMAL HIGH (ref 70–99)
Potassium: 4.6 meq/L (ref 3.5–5.1)
Sodium: 135 meq/L (ref 135–145)
Total Bilirubin: 0.6 mg/dL (ref 0.2–1.2)
Total Protein: 7.3 g/dL (ref 6.0–8.3)

## 2023-06-02 LAB — TSH: TSH: 3.47 u[IU]/mL (ref 0.35–5.50)

## 2023-06-02 LAB — PSA, MEDICARE: PSA: 0.19 ng/mL (ref 0.10–4.00)

## 2023-06-02 LAB — HEMOGLOBIN A1C: Hgb A1c MFr Bld: 8.7 % — ABNORMAL HIGH (ref 4.6–6.5)

## 2023-06-03 DIAGNOSIS — Z794 Long term (current) use of insulin: Secondary | ICD-10-CM

## 2023-06-05 ENCOUNTER — Other Ambulatory Visit: Payer: Self-pay | Admitting: Primary Care

## 2023-06-05 DIAGNOSIS — E1141 Type 2 diabetes mellitus with diabetic mononeuropathy: Secondary | ICD-10-CM

## 2023-06-05 DIAGNOSIS — I10 Essential (primary) hypertension: Secondary | ICD-10-CM

## 2023-06-05 DIAGNOSIS — F419 Anxiety disorder, unspecified: Secondary | ICD-10-CM

## 2023-06-05 DIAGNOSIS — F32A Depression, unspecified: Secondary | ICD-10-CM

## 2023-06-05 DIAGNOSIS — E785 Hyperlipidemia, unspecified: Secondary | ICD-10-CM

## 2023-06-05 DIAGNOSIS — E119 Type 2 diabetes mellitus without complications: Secondary | ICD-10-CM

## 2023-06-09 ENCOUNTER — Ambulatory Visit: Payer: Medicare HMO

## 2023-06-09 VITALS — Ht 72.0 in | Wt 321.0 lb

## 2023-06-09 DIAGNOSIS — Z0000 Encounter for general adult medical examination without abnormal findings: Secondary | ICD-10-CM | POA: Diagnosis not present

## 2023-06-09 MED ORDER — TIRZEPATIDE 2.5 MG/0.5ML ~~LOC~~ SOAJ
2.5000 mg | SUBCUTANEOUS | 0 refills | Status: DC
Start: 2023-06-09 — End: 2023-06-16

## 2023-06-09 NOTE — Patient Instructions (Signed)
 Scott Rice , Thank you for taking time to come for your Medicare Wellness Visit. I appreciate your ongoing commitment to your health goals. Please review the following plan we discussed and let me know if I can assist you in the future.   Referrals/Orders/Follow-Ups/Clinician Recommendations: none  This is a list of the screening recommended for you and due dates:  Health Maintenance  Topic Date Due   Pneumococcal Vaccination (2 of 2 - PCV) 10/28/2017   Colon Cancer Screening  02/13/2020   Eye exam for diabetics  05/18/2023   COVID-19 Vaccine (1 - 2024-25 season) 06/17/2023*   Hemoglobin A1C  12/02/2023   Yearly kidney health urinalysis for diabetes  12/30/2023   Complete foot exam   12/30/2023   Yearly kidney function blood test for diabetes  05/31/2024   Medicare Annual Wellness Visit  06/08/2024   DTaP/Tdap/Td vaccine (2 - Td or Tdap) 05/15/2028   Flu Shot  Completed   Hepatitis C Screening  Completed   HIV Screening  Completed   Zoster (Shingles) Vaccine  Completed   HPV Vaccine  Aged Out  *Topic was postponed. The date shown is not the original due date.    Advanced directives: (Copy Requested) Please bring a copy of your health care power of attorney and living will to the office to be added to your chart at your convenience. You can mail to [facility] [address] [place], [place] [address] or email to [email or web address]  Next Medicare Annual Wellness Visit scheduled for next year: Yes 06/12/24 @ 1:40pm televisit

## 2023-06-09 NOTE — Progress Notes (Signed)
 Subjective:   Scott Rice is a 58 y.o. who presents for a Medicare Wellness preventive visit.  Visit Complete: Virtual I connected with  Particia Rice on 06/09/23 by a audio enabled telemedicine application and verified that I am speaking with the correct person using two identifiers.  Patient Location: Home  Provider Location: Office/Clinic  I discussed the limitations of evaluation and management by telemedicine. The patient expressed understanding and agreed to proceed.  Vital Signs: Because this visit was a virtual/telehealth visit, some criteria may be missing or patient reported. Any vitals not documented were not able to be obtained and vitals that have been documented are patient reported.  VideoDeclined- This patient declined Librarian, academic. Therefore the visit was completed with audio only.  Persons Participating in Visit: Patient.  AWV Questionnaire: No: Patient Medicare AWV questionnaire was not completed prior to this visit.  Cardiac Risk Factors include: advanced age (>33men, >57 women);dyslipidemia;diabetes mellitus;hypertension;male gender;obesity (BMI >30kg/m2)     Objective:    Today's Vitals   06/09/23 1304  Weight: (!) 321 lb (145.6 kg)  Height: 6' (1.829 m)  PainSc: 3    Body mass index is 43.54 kg/m.     06/09/2023    1:20 PM 06/08/2022   10:07 AM  Advanced Directives  Does Patient Have a Medical Advance Directive? Yes No  Type of Estate agent of [place];Living will   Copy of Healthcare Power of Attorney in Chart? No - copy requested   Would patient like information on creating a medical advance directive?  No - Patient declined    Current Medications (verified) Outpatient Encounter Medications as of 06/09/2023  Medication Sig   acetaminophen (TYLENOL) 500 MG tablet Take 1,000 mg by mouth in the morning and at bedtime.   atorvastatin (LIPITOR) 20 MG tablet TAKE 1 TABLET EVERY EVENING  FOR CHOLESTEROL   B Complex-C (SUPER B COMPLEX PO) Take by mouth 2 (two) times daily.   busPIRone (BUSPAR) 5 MG tablet Take 1 tablet (5 mg total) by mouth 2 (two) times daily. For anxiety   Calcium Carbonate-Vitamin D (CALCIUM 600/VITAMIN D PO) Take by mouth daily.   cyclobenzaprine (FLEXERIL) 10 MG tablet Take 0.5-1 tablets (5-10 mg total) by mouth at bedtime as needed for muscle spasms.   Diclofenac Sodium 1 % CREA Apply topically in the morning, at noon, and at bedtime.   famotidine (PEPCID) 20 MG tablet Take 20 mg by mouth daily.   FLUoxetine (PROZAC) 20 MG capsule TAKE 1 CAPSULE DAILY FOR ANXIETY AND DEPRESSION. TAKE WITH 40 MG DOSE.   FLUoxetine (PROZAC) 40 MG capsule TAKE 1 CAPSULE EVERY DAY FOR ANXIETY   gabapentin (NEURONTIN) 300 MG capsule TAKE 1 CAPSULE TWICE DAILY FOR NERVE PAIN   glipiZIDE (GLUCOTROL) 10 MG tablet TAKE 1 TABLET TWICE DAILY BEFORE MEALS FOR DIABETES   insulin NPH-regular Human (NOVOLIN 70/30) (70-30) 100 UNIT/ML injection INJECT 40 UNITS IN THE MORNING AND 35 UNITS AT NIGHT FOR DIABETES.   Insulin Syringe-Needle U-100 (INSULIN SYRINGE 1CC/30GX5/16") 30G X 5/16" 1 ML MISC Inject twice daily with insulin.   levocetirizine (XYZAL) 5 MG tablet Take 1 tablet (5 mg total) by mouth every evening. For allergies   lisinopril (ZESTRIL) 10 MG tablet TAKE 1 TABLET EVERY DAY FOR BLOOD PRESSURE   meloxicam (MOBIC) 7.5 MG tablet Take 1 tablet (7.5 mg total) by mouth in the morning and at bedtime. As needed for back pain.   metFORMIN (GLUCOPHAGE) 1000 MG tablet TAKE 1 TABLET  TWICE DAILY WITH MEALS FOR DIABETES   mupirocin ointment (BACTROBAN) 2 % APPLY 1 APPLICATION TOPICALLY 2 (TWO) TIMES DAILY.   traZODone (DESYREL) 50 MG tablet TAKE 1/2 TO 1 TABLET  BY MOUTH AT BEDTIME AS NEEDED FOR SLEEP.   No facility-administered encounter medications on file as of 06/09/2023.    Allergies (verified) Patient has no known allergies.   History: Past Medical History:  Diagnosis Date    Anxiety and depression    Cellulitis 02/24/2021   Cellulitis and abscess of neck 03/02/2021   Diabetic neuropathy (HCC)    Essential hypertension    Hyperlipidemia    Type 2 diabetes mellitus (HCC)    No past surgical history on file. No family history on file. Social History   Socioeconomic History   Marital status: Married    Spouse name: Not on file   Number of children: Not on file   Years of education: Not on file   Highest education level: Bachelor's degree (e.g., BA, AB, BS)  Occupational History   Not on file  Tobacco Use   Smoking status: Former   Smokeless tobacco: Never   Tobacco comments:    quit 40 years ago  Substance and Sexual Activity   Alcohol use: No   Drug use: No   Sexual activity: Not on file  Other Topics Concern   Not on file  Social History Narrative   Married.   No children.   Works as an Event organiser.   Enjoys watching movies, spending time with family.    Social Drivers of Corporate investment banker Strain: Low Risk  (06/09/2023)   Overall Financial Resource Strain (CARDIA)    Difficulty of Paying Living Expenses: Not hard at all  Recent Concern: Financial Resource Strain - Medium Risk (05/30/2023)   Overall Financial Resource Strain (CARDIA)    Difficulty of Paying Living Expenses: Somewhat hard  Food Insecurity: No Food Insecurity (06/09/2023)   Hunger Vital Sign    Worried About Running Out of Food in the Last Year: Never true    Ran Out of Food in the Last Year: Never true  Transportation Needs: No Transportation Needs (06/09/2023)   PRAPARE - Administrator, Civil Service (Medical): No    Lack of Transportation (Non-Medical): No  Physical Activity: Insufficiently Active (06/09/2023)   Exercise Vital Sign    Days of Exercise per Week: 5 days    Minutes of Exercise per Session: 10 min  Stress: Stress Concern Present (06/09/2023)   [employer] of Occupational Health - Occupational Stress Questionnaire     Feeling of Stress : Very much  Social Connections: Moderately Isolated (06/09/2023)   Social Connection and Isolation Panel [NHANES]    Frequency of Communication with Friends and Family: Twice a week    Frequency of Social Gatherings with Friends and Family: Twice a week    Attends Religious Services: Never    Database administrator or Organizations: No    Attends Engineer, structural: Never    Marital Status: Married    Tobacco Counseling Counseling given: Not Answered Tobacco comments: quit 40 years ago    Clinical Intake:  Pre-visit preparation completed: Yes  Pain : 0-10 Pain Score: 3  Pain Type: Chronic pain Pain Location: Knee (both) Pain Descriptors / Indicators: Sore Pain Onset: More than a month ago Pain Frequency: Intermittent Pain Relieving Factors: Aleive,cold  Pain Relieving Factors: Aleive,cold  BMI - recorded: 43.54 Nutritional Status: BMI > 30  Obese Nutritional Risks: None Diabetes: Yes CBG done?: Yes (pt BS 87 this am at home) CBG resulted in Enter/ Edit results?: No Did pt. bring in CBG monitor from home?: No  Lab Results  Component Value Date   HGBA1C 8.7 (H) 06/01/2023   HGBA1C 7.5 (A) 12/30/2022   HGBA1C 7.8 (H) 06/08/2022     How often do you need to have someone help you when you read instructions, pamphlets, or other written materials from your doctor or pharmacy?: 1 - Never  Interpreter Needed?: No  Comments: lives with wife Information entered by :: B.Reneshia Zuccaro,LPN   Activities of Daily Living     06/09/2023    1:21 PM  In your present state of health, do you have any difficulty performing the following activities:  Hearing? 0  Vision? 0  Difficulty concentrating or making decisions? 1  Walking or climbing stairs? 1  Dressing or bathing? 0  Doing errands, shopping? 0  Preparing Food and eating ? N  Using the Toilet? N  In the past six months, have you accidently leaked urine? Y  Do you have problems with loss of  bowel control? N  Managing your Medications? N  Managing your Finances? N  Housekeeping or managing your Housekeeping? N    Patient Care Team: Doreene Nest, NP as PCP - General (Internal Medicine)  Indicate any recent Medical Services you may have received from other than [facility] providers in the past year (date may be approximate).     Assessment:   This is a routine wellness examination for Wi[place].  Hearing/Vision screen Hearing Screening - Comments:: Pt says his hearing is good Vision Screening - Comments:: Pt says his vision is good:wears readers only Dr [facility]   Goals Addressed             This Visit's Progress    Patient Stated   On track    06/09/23-Get retinopathy under control.       Depression Screen     06/09/2023    1:16 PM 06/01/2023    2:01 PM 06/08/2022   10:02 AM 06/10/2021   10:25 AM 05/20/2020   11:15 AM  PHQ 2/9 Scores  PHQ - 2 Score 6 6 5 5 4   PHQ- 9 Score 10 18 18 13 13     Fall Risk     06/09/2023    1:10 PM 06/01/2023    2:00 PM 06/08/2022    9:56 AM 06/10/2021    9:31 AM  Fall Risk   Falls in the past year? 1 1 1  0  Comment pt having dizzy spells:fell 3times in 3 weeks     Number falls in past yr: 0 1 1   Comment   gets dizzy and trips   Injury with Fall? 0 0 0   Risk for fall due to : No Fall Risks No Fall Risks Impaired balance/gait;Other (Comment)   Risk for fall due to: Comment   dizziness   Follow up Education provided;Falls prevention discussed Falls evaluation completed Falls evaluation completed;Education provided;Falls prevention discussed Falls evaluation completed    MEDICARE RISK AT HOME:  Medicare Risk at Home Any stairs in or around the home?: Yes If so, are there any without handrails?: Yes Home free of loose throw rugs in walkways, pet beds, electrical cords, etc?: Yes Adequate lighting in your home to reduce risk of falls?: Yes Life alert?: No Use of a cane, walker or w/c?: Yes (cane if needed) Grab  bars in  the bathroom?: Yes Shower chair or bench in shower?: No Elevated toilet seat or a handicapped toilet?: Yes  TIMED UP AND GO:  Was the test performed?  No  Cognitive Function: 6CIT completed        06/09/2023    1:22 PM 06/08/2022   10:09 AM  6CIT Screen  What Year? 0 points 0 points  What month? 0 points 0 points  What time? 0 points 0 points  Count back from 20 0 points 0 points  Months in reverse 0 points 0 points  Repeat phrase 0 points 0 points  Total Score 0 points 0 points    Immunizations Immunization History  Administered Date(s) Administered   Influenza, Seasonal, Injecte, Preservative Fre 12/30/2022   Influenza,inj,Quad PF,6+ Mos 05/20/2020, 02/24/2021, 01/12/2022   Influenza-Unspecified 12/27/2017, 01/02/2019   Pneumococcal Polysaccharide-23 10/28/2016   Tdap 05/16/2018   Zoster Recombinant(Shingrix) 06/10/2021, 11/10/2021    Screening Tests Health Maintenance  Topic Date Due   Pneumococcal Vaccine 11-42 Years old (2 of 2 - PCV) 10/28/2017   Colonoscopy  02/13/2020   OPHTHALMOLOGY EXAM  05/18/2023   COVID-19 Vaccine (1 - 2024-25 season) 06/17/2023 (Originally 11/14/2022)   HEMOGLOBIN A1C  12/02/2023   Diabetic kidney evaluation - Urine ACR  12/30/2023   FOOT EXAM  12/30/2023   Diabetic kidney evaluation - eGFR measurement  05/31/2024   Medicare Annual Wellness (AWV)  06/08/2024   DTaP/Tdap/Td (2 - Td or Tdap) 05/15/2028   INFLUENZA VACCINE  Completed   Hepatitis C Screening  Completed   HIV Screening  Completed   Zoster Vaccines- Shingrix  Completed   HPV VACCINES  Aged Out    Health Maintenance  Health Maintenance Due  Topic Date Due   Pneumococcal Vaccine 55-63 Years old (2 of 2 - PCV) 10/28/2017   Colonoscopy  02/13/2020   OPHTHALMOLOGY EXAM  05/18/2023   Health Maintenance Items Addressed: Colorectal Cancer Screening: Patient declined colorectal cancer screening (for now w/eye appts)  Additional Screening:  Vision Screening:  Recommended annual ophthalmology exams for early detection of glaucoma and other disorders of the eye.  Dental Screening: Recommended annual dental exams for proper oral hygiene  Community Resource Referral / Chronic Care Management: CRR required this visit?  No   CCM required this visit?  No    Plan:     I have personally reviewed and noted the following in the patient's chart:   Medical and social history Use of alcohol, tobacco or illicit drugs  Current medications and supplements including opioid prescriptions. Patient is not currently taking opioid prescriptions. Functional ability and status Nutritional status Physical activity Advanced directives List of other physicians Hospitalizations, surgeries, and ER visits in previous 12 months Vitals Screenings to include cognitive, depression, and falls Referrals and appointments  In addition, I have reviewed and discussed with patient certain preventive protocols, quality metrics, and best practice recommendations. A written personalized care plan for preventive services as well as general preventive health recommendations were provided to patient.    Sue Lush, LPN   [id number]/95/[id number]   After Visit Summary: (MyChart) Due to this being a telephonic visit, the after visit summary with patients personalized plan was offered to patient via MyChart   Notes: Nothing significant to report at this time.

## 2023-06-16 MED ORDER — OZEMPIC (0.25 OR 0.5 MG/DOSE) 2 MG/3ML ~~LOC~~ SOPN: PEN_INJECTOR | SUBCUTANEOUS | 0 refills | Status: DC

## 2023-06-20 ENCOUNTER — Other Ambulatory Visit: Payer: Self-pay | Admitting: Primary Care

## 2023-06-20 DIAGNOSIS — E1140 Type 2 diabetes mellitus with diabetic neuropathy, unspecified: Secondary | ICD-10-CM

## 2023-06-21 ENCOUNTER — Ambulatory Visit: Admitting: Primary Care

## 2023-06-23 ENCOUNTER — Ambulatory Visit
Admission: RE | Admit: 2023-06-23 | Discharge: 2023-06-23 | Disposition: A | Discharge status: Home / Self Care | Source: Ambulatory Visit | Attending: Primary Care | Admitting: Primary Care

## 2023-06-23 DIAGNOSIS — R42 Dizziness and giddiness: Secondary | ICD-10-CM | POA: Insufficient documentation

## 2023-06-30 ENCOUNTER — Ambulatory Visit: Admitting: Clinical

## 2023-06-30 DIAGNOSIS — F411 Generalized anxiety disorder: Secondary | ICD-10-CM | POA: Diagnosis not present

## 2023-06-30 DIAGNOSIS — F331 Major depressive disorder, recurrent, moderate: Secondary | ICD-10-CM | POA: Diagnosis not present

## 2023-06-30 DIAGNOSIS — R002 Palpitations: Secondary | ICD-10-CM | POA: Diagnosis not present

## 2023-06-30 NOTE — Progress Notes (Signed)
   Doree Barthel, LCSW
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
## 2023-07-05 DIAGNOSIS — R002 Palpitations: Secondary | ICD-10-CM | POA: Diagnosis not present

## 2023-07-06 ENCOUNTER — Ambulatory Visit (INDEPENDENT_AMBULATORY_CARE_PROVIDER_SITE_OTHER): Payer: Medicare HMO | Admitting: Primary Care

## 2023-07-06 ENCOUNTER — Encounter: Payer: Self-pay | Admitting: Primary Care

## 2023-07-06 VITALS — BP 126/68 | HR 89 | Temp 97.2°F | Ht 72.0 in | Wt 321.0 lb

## 2023-07-06 DIAGNOSIS — E1141 Type 2 diabetes mellitus with diabetic mononeuropathy: Secondary | ICD-10-CM

## 2023-07-06 DIAGNOSIS — Z1211 Encounter for screening for malignant neoplasm of colon: Secondary | ICD-10-CM

## 2023-07-06 DIAGNOSIS — F32A Depression, unspecified: Secondary | ICD-10-CM

## 2023-07-06 DIAGNOSIS — I10 Essential (primary) hypertension: Secondary | ICD-10-CM | POA: Diagnosis not present

## 2023-07-06 DIAGNOSIS — F419 Anxiety disorder, unspecified: Secondary | ICD-10-CM | POA: Diagnosis not present

## 2023-07-06 DIAGNOSIS — E1165 Type 2 diabetes mellitus with hyperglycemia: Secondary | ICD-10-CM | POA: Diagnosis not present

## 2023-07-06 DIAGNOSIS — E785 Hyperlipidemia, unspecified: Secondary | ICD-10-CM | POA: Diagnosis not present

## 2023-07-06 DIAGNOSIS — M545 Low back pain, unspecified: Secondary | ICD-10-CM

## 2023-07-06 DIAGNOSIS — G8929 Other chronic pain: Secondary | ICD-10-CM

## 2023-07-06 DIAGNOSIS — K219 Gastro-esophageal reflux disease without esophagitis: Secondary | ICD-10-CM | POA: Diagnosis not present

## 2023-07-06 DIAGNOSIS — Z794 Long term (current) use of insulin: Secondary | ICD-10-CM

## 2023-07-06 DIAGNOSIS — J3089 Other allergic rhinitis: Secondary | ICD-10-CM

## 2023-07-06 DIAGNOSIS — R42 Dizziness and giddiness: Secondary | ICD-10-CM | POA: Diagnosis not present

## 2023-07-06 DIAGNOSIS — Z0000 Encounter for general adult medical examination without abnormal findings: Secondary | ICD-10-CM | POA: Diagnosis not present

## 2023-07-06 DIAGNOSIS — E113393 Type 2 diabetes mellitus with moderate nonproliferative diabetic retinopathy without macular edema, bilateral: Secondary | ICD-10-CM

## 2023-07-06 DIAGNOSIS — R002 Palpitations: Secondary | ICD-10-CM

## 2023-07-06 LAB — LIPID PANEL
Cholesterol: 101 mg/dL (ref 0–200)
HDL: 38.1 mg/dL — ABNORMAL LOW (ref >39.00)
LDL Cholesterol: 36 mg/dL (ref 0–99)
NonHDL: 62.54
Total CHOL/HDL Ratio: 3
Triglycerides: 131 mg/dL (ref 0.0–149.0)
VLDL: 26.2 mg/dL (ref 0.0–40.0)

## 2023-07-06 NOTE — Progress Notes (Signed)
 Subjective:    Patient ID: Scott Rice, male    DOB: Jun 23, 1965, 58 y.o.   MRN: [id number]  HPI  Scott Rice is a very pleasant 58 y.o. male who presents today for complete physical and follow up of chronic conditions.   Immunizations: -Tetanus: Completed in 2020 -Shingles: Completed Shingrix  series -Pneumonia: Completed in 2018  Diet: Fair diet. Has cut back on candy. Exercise: No regular exercise.  Eye exam: Completes annually  Dental exam: Completes semi-annually    Colonoscopy: Overdue, declined last year including Cologuard.    PSA: Due   BP Readings from Last 3 Encounters:  07/06/23 126/68  06/01/23 110/68  12/30/22 132/82        Review of Systems  Constitutional:  Negative for unexpected weight change.  HENT:  Negative for rhinorrhea.   Respiratory:  Negative for cough and shortness of breath.   Cardiovascular:  Negative for chest pain.  Gastrointestinal:  Negative for constipation and diarrhea.  Genitourinary:  Negative for difficulty urinating.  Musculoskeletal:  Negative for arthralgias and myalgias.  Skin:  Negative for rash.  Allergic/Immunologic: Negative for environmental allergies.  Neurological:  Positive for dizziness. Negative for numbness and headaches.  Psychiatric/Behavioral:  The patient is not nervous/anxious.          Past Medical History:  Diagnosis Date   Anxiety and depression    Cellulitis 02/24/2021   Cellulitis and abscess of neck 03/02/2021   Diabetic neuropathy [facility])    Essential hypertension    Frozen shoulder 07/20/2016   Hyperlipidemia    Type 2 diabetes mellitus (HCC)     Social History   Socioeconomic History   Marital status: Married    Spouse name: Not on file   Number of children: Not on file   Years of education: Not on file   Highest education level: Bachelor's degree (e.g., BA, AB, BS)  Occupational History   Not on file  Tobacco Use   Smoking status: Former   Smokeless tobacco: Never    Tobacco comments:    quit 40 years ago  Substance and Sexual Activity   Alcohol use: No   Drug use: No   Sexual activity: Not on file  Other Topics Concern   Not on file  Social History Narrative   Married.   No children.   Works as an Event organiser.   Enjoys watching movies, spending time with family.    Social Drivers of Corporate investment banker Strain: Low Risk  (06/09/2023)   Overall Financial Resource Strain (CARDIA)    Difficulty of Paying Living Expenses: Not hard at all  Recent Concern: Financial Resource Strain - Medium Risk (05/30/2023)   Overall Financial Resource Strain (CARDIA)    Difficulty of Paying Living Expenses: Somewhat hard  Food Insecurity: No Food Insecurity (06/09/2023)   Hunger Vital Sign    Worried About Running Out of Food in the Last Year: Never true    Ran Out of Food in the Last Year: Never true  Transportation Needs: No Transportation Needs (06/09/2023)   PRAPARE - Administrator, Civil Service (Medical): No    Lack of Transportation (Non-Medical): No  Physical Activity: Insufficiently Active (06/09/2023)   Exercise Vital Sign    Days of Exercise per Week: 5 days    Minutes of Exercise per Session: 10 min  Stress: Stress Concern Present (06/09/2023)   [employer] of Occupational Health - Occupational Stress Questionnaire    Feeling of Stress : Very much  Social Connections: Moderately Isolated (06/09/2023)   Social Connection and Isolation Panel [NHANES]    Frequency of Communication with Friends and Family: Twice a week    Frequency of Social Gatherings with Friends and Family: Twice a week    Attends Religious Services: Never    Database administrator or Organizations: No    Attends Banker Meetings: Never    Marital Status: Married  Catering manager Violence: Not At Risk (06/09/2023)   Humiliation, Afraid, Rape, and Kick questionnaire    Fear of Current or Ex-Partner: No    Emotionally Abused: No     Physically Abused: No    Sexually Abused: No    History reviewed. No pertinent surgical history.  History reviewed. No pertinent family history.  No Known Allergies  Current Outpatient Medications on File Prior to Visit  Medication Sig Dispense Refill   acetaminophen (TYLENOL) 500 MG tablet Take 1,000 mg by mouth in the morning and at bedtime.     atorvastatin  (LIPITOR) 20 MG tablet TAKE 1 TABLET EVERY EVENING FOR CHOLESTEROL 90 tablet 0   B Complex-C (SUPER B COMPLEX PO) Take by mouth 2 (two) times daily.     busPIRone  (BUSPAR ) 5 MG tablet Take 1 tablet (5 mg total) by mouth 2 (two) times daily. For anxiety 180 tablet 0   Calcium  Carbonate-Vitamin D (CALCIUM  600/VITAMIN D PO) Take by mouth daily.     cyclobenzaprine  (FLEXERIL ) 10 MG tablet Take 0.5-1 tablets (5-10 mg total) by mouth at bedtime as needed for muscle spasms. 30 tablet 1   Diclofenac Sodium 1 % CREA Apply topically in the morning, at noon, and at bedtime.     famotidine (PEPCID) 20 MG tablet Take 20 mg by mouth daily.     FLUoxetine  (PROZAC ) 20 MG capsule TAKE 1 CAPSULE DAILY FOR ANXIETY AND DEPRESSION. TAKE WITH 40 MG DOSE. 90 capsule 0   FLUoxetine  (PROZAC ) 40 MG capsule TAKE 1 CAPSULE EVERY DAY FOR ANXIETY 90 capsule 0   gabapentin  (NEURONTIN ) 300 MG capsule TAKE 1 CAPSULE TWICE DAILY FOR NERVE PAIN 180 capsule 0   glipiZIDE  (GLUCOTROL ) 10 MG tablet TAKE 1 TABLET TWICE DAILY BEFORE MEALS FOR DIABETES 180 tablet 1   insulin  NPH-regular Human (NOVOLIN  70/30) (70-30) 100 UNIT/ML injection INJECT 40 UNITS IN THE MORNING AND 35 UNITS AT NIGHT FOR DIABETES. 70 mL 0   Insulin  Syringe-Needle U-100 (INSULIN  SYRINGE 1CC/30GX5/16") 30G X 5/16" 1 ML MISC Inject twice daily with insulin . 200 each 3   levocetirizine (XYZAL ) 5 MG tablet Take 1 tablet (5 mg total) by mouth every evening. For allergies 90 tablet 3   lisinopril  (ZESTRIL ) 10 MG tablet TAKE 1 TABLET EVERY DAY FOR BLOOD PRESSURE 90 tablet 0   meloxicam  (MOBIC ) 7.5 MG tablet  Take 1 tablet (7.5 mg total) by mouth in the morning and at bedtime. As needed for back pain. 180 tablet 0   metFORMIN  (GLUCOPHAGE ) 1000 MG tablet TAKE 1 TABLET TWICE DAILY WITH MEALS FOR DIABETES 180 tablet 1   mupirocin  ointment (BACTROBAN ) 2 % APPLY 1 APPLICATION TOPICALLY 2 (TWO) TIMES DAILY. 22 g 0   Semaglutide ,0.25 or 0.5MG /DOS, (OZEMPIC , 0.25 OR 0.5 MG/DOSE,) 2 MG/3ML SOPN Inject 0.25 mg into the skin once weekly for 4 weeks, then increase to 0.5 mg once weekly thereafter for diabetes. 9 mL 0   traZODone  (DESYREL ) 50 MG tablet TAKE 1/2 TO 1 TABLET  BY MOUTH AT BEDTIME AS NEEDED FOR SLEEP. 90 tablet 0   No current  facility-administered medications on file prior to visit.    BP 126/68   Pulse 89   Temp (!) 97.2 F (36.2 C) (Temporal)   Ht 6' (1.829 m)   Wt (!) 321 lb (145.6 kg)   SpO2 98%   BMI 43.54 kg/m  Objective:   Physical Exam HENT:     Right Ear: Tympanic membrane and ear canal normal.     Left Ear: Tympanic membrane and ear canal normal.  Eyes:     Pupils: Pupils are equal, round, and reactive to light.  Cardiovascular:     Rate and Rhythm: Normal rate and regular rhythm.  Pulmonary:     Effort: Pulmonary effort is normal.     Breath sounds: Normal breath sounds.  Abdominal:     General: Bowel sounds are normal.     Palpations: Abdomen is soft.     Tenderness: There is no abdominal tenderness.  Musculoskeletal:        General: Normal range of motion.     Cervical back: Neck supple.  Skin:    General: Skin is warm and dry.  Neurological:     Mental Status: He is alert and oriented to person, place, and time.     Cranial Nerves: No cranial nerve deficit.     Deep Tendon Reflexes:     Reflex Scores:      Patellar reflexes are 2+ on the right side and 2+ on the left side. Psychiatric:        Mood and Affect: Mood normal.           Assessment & Plan:  Preventative health care Assessment & Plan: Immunizations UTD. Colonoscopy overdue, he declines.  He  agrees for [employer].  Orders placed. PSA UTD and reviewed  Discussed the importance of a healthy diet and regular exercise in order for weight loss, and to reduce the risk of further co-morbidity.  Exam stable. Labs pending.  Follow up in 1 year for repeat physical.    Screening for colon cancer -     Cologuard  Essential hypertension Assessment & Plan: Controlled.  Continue lisinopril  10 mg daily.  CMP reviewed from March 2025.    Gastroesophageal reflux disease, unspecified whether esophagitis present Assessment & Plan: Controlled.  Continue famotidine 20 mg daily.    Diabetic mononeuropathy associated with type 2 diabetes mellitus (HCC) Assessment & Plan: Controlled.  He has been taking 900 mg HS of gabapentin  for months with improvement.  Continue gabapentin  900 mg HS.   He will update when needing refills.    Moderate nonproliferative diabetic retinopathy of both eyes without macular edema associated with type 2 diabetes mellitus [facility]) Assessment & Plan: Following with [facility]. Continue retinal visits.   Working to get glucose under control.    Type 2 diabetes mellitus with hyperglycemia, with long-term current use of insulin  [facility]) Assessment & Plan: Uncontrolled with A1C of 8.7 in March 2025.  Commended him on reducing candy consumption. He has yet to start Ozempic  due to pharmacy problems. We will contact pharmacy to find out the problem.  Start Ozempic  once approved. Continue glipizide  10 mg BID, metformin  1000 mg BID. Continue Novolin  70/30 40 units in AM and 35 units in PM.   Follow up in late June 2025.   Anxiety and depression Assessment & Plan: Improving.  Continue fluoxetine  60 mg daily, buspirone  5 mg BID, Trazodone  50 mg HS PRN. Continue with therapy follow up.   Chronic right-sided low back pain without sciatica  Assessment & Plan: Stable.  Continue meloxicam  7.5 mg BID PRN, gabapentin  900 mg  HS.   Dizziness Assessment & Plan: Continued but stable.   Awaiting CT head results per radiology. Awaiting Holter monitor readings.      Environmental and seasonal allergies Assessment & Plan: Improved!  Continue Xyzal  5 mg daily.   Hyperlipidemia, unspecified hyperlipidemia type Assessment & Plan: Repeat lipid panel pending.  Discussed the importance of a healthy diet and regular exercise in order for weight loss, and to reduce the risk of further co-morbidity. Continue atorvastatin  20 mg daily.  Orders: -     Lipid panel  Palpitations Assessment & Plan: Awaiting Holter monitor read and results. Labs from March 2025 reviewed.         Kenta Laster K Tanveer Dobberstein, NP

## 2023-07-06 NOTE — Assessment & Plan Note (Signed)
 Controlled.  He has been taking 900 mg HS of gabapentin  for months with improvement.  Continue gabapentin  900 mg HS.   He will update when needing refills.

## 2023-07-06 NOTE — Assessment & Plan Note (Signed)
Repeat lipid panel pending.  Discussed the importance of a healthy diet and regular exercise in order for weight loss, and to reduce the risk of further co-morbidity. Continue atorvastatin 20 mg daily.

## 2023-07-06 NOTE — Assessment & Plan Note (Signed)
 Awaiting Holter monitor read and results. Labs from March 2025 reviewed.

## 2023-07-06 NOTE — Assessment & Plan Note (Signed)
Improved.  Continue Xyzal 5 mg daily.

## 2023-07-06 NOTE — Assessment & Plan Note (Signed)
 Continued but stable.   Awaiting CT head results per radiology. Awaiting Holter monitor readings.

## 2023-07-06 NOTE — Assessment & Plan Note (Signed)
 Uncontrolled with A1C of 8.7 in March 2025.  Commended him on reducing candy consumption. He has yet to start Ozempic  due to pharmacy problems. We will contact pharmacy to find out the problem.  Start Ozempic  once approved. Continue glipizide  10 mg BID, metformin  1000 mg BID. Continue Novolin  70/30 40 units in AM and 35 units in PM.   Follow up in late June 2025.

## 2023-07-06 NOTE — Assessment & Plan Note (Signed)
 Stable.  Continue meloxicam  7.5 mg BID PRN, gabapentin  900 mg HS.

## 2023-07-06 NOTE — Assessment & Plan Note (Signed)
 Controlled.  Continue lisinopril  10 mg daily.  CMP reviewed from March 2025.

## 2023-07-06 NOTE — Patient Instructions (Addendum)
 Call [facility].   The Ozempic  is on hold because you need to pay a copay of $ 344 this month.  Your $250 deductible was applied. Copay for next time will be $94. Please call [phone] to provide payment info and then medication can be sent.   Start semaglutide  (Ozempic ) for diabetes. Start by injecting 0.25 mg into the skin once weekly for 4 weeks, then increase to 0.5 mg once weekly thereafter.   Schedule a follow-up for diabetes in late June.  Complete Cologuard once received.  It was a pleasure to see you today!

## 2023-07-06 NOTE — Assessment & Plan Note (Signed)
 Controlled. ? ?Continue famotidine 20 mg daily.  ?

## 2023-07-06 NOTE — Assessment & Plan Note (Addendum)
 Improving.  Continue fluoxetine  60 mg daily, buspirone  5 mg BID, Trazodone  50 mg HS PRN. Continue with therapy follow up.

## 2023-07-06 NOTE — Assessment & Plan Note (Signed)
 Following with [facility]. Continue retinal visits.   Working to get glucose under control.

## 2023-07-06 NOTE — Assessment & Plan Note (Signed)
 Immunizations UTD. Colonoscopy overdue, he declines.  He agrees for [employer].  Orders placed. PSA UTD and reviewed  Discussed the importance of a healthy diet and regular exercise in order for weight loss, and to reduce the risk of further co-morbidity.  Exam stable. Labs pending.  Follow up in 1 year for repeat physical.

## 2023-07-28 ENCOUNTER — Ambulatory Visit: Admitting: Urology

## 2023-08-09 ENCOUNTER — Ambulatory Visit: Admitting: Clinical

## 2023-08-09 DIAGNOSIS — F331 Major depressive disorder, recurrent, moderate: Secondary | ICD-10-CM | POA: Diagnosis not present

## 2023-08-09 DIAGNOSIS — F411 Generalized anxiety disorder: Secondary | ICD-10-CM

## 2023-08-09 NOTE — Progress Notes (Signed)
  Behavioral Health Counselor/Therapist Progress Note  Patient ID: Scott Rice, MRN: [id number]    Date: 08/09/23  Time Spent: 10:36  am - 11:25 am : 49 Minutes  Treatment Type: Individual Therapy.  Reported Symptoms: anxiety, decreased motivation  Mental Status Exam: Appearance:  Neat and Well Groomed     Behavior: Appropriate  Motor: Normal  Speech/Language:  Clear and Coherent and Normal Rate  Affect: Appropriate  Mood: anxious  Thought process: normal  Thought content:   WNL  Sensory/Perceptual disturbances:   WNL  Orientation: oriented to person, place, time/date, and situation  Attention: Good  Concentration: Good  Memory: WNL  Fund of knowledge:  Good  Insight:   Good  Judgment:  Good  Impulse Control: Good   Risk Assessment: Danger to Self:  No Patient denied current suicidal ideation  Self-injurious Behavior: No Danger to Others: No Patient denied current homicidal ideation Duty to Warn:no Physical Aggression / Violence:No  Access to Firearms a concern: No  Gang Involvement:No   Subjective:  Patient stated, "nothing has changed, I feel better" in response to events since last session. Patient reported he started taking ozempic , 1 injection, once weekly.  Patient stated, "I seem to feel like depending on what day it is, April is bad because that's when my father passed away, then 2023-07-25 I feel better". Patient stated, "I'm definitely a little anxious going to work on Fridays", "I go right to the worst case scenario in my brain". Patient stated, "I'm not very motivated". Patient reported he has not experienced a panic attack in one month. Patient stated, "it's what I've been experiencing" in response to diagnoses. Patient stated, "sounds good" in response to treatment recommendations.   Interventions: Motivational Interviewing. Clinician conducted session in person at clinician's office at [facility]. Reviewed events since last session and assessed  for changes. Clinician reviewed diagnoses and treatment recommendations. Provided psycho education related to diagnoses and treatment. Clinician requested for homework patient consider potential goals for therapy.   Collaboration of Care: Other not required at this time  Diagnosis:  Major depressive disorder, recurrent episode, moderate (HCC)  Generalized anxiety disorder with panic attacks   Plan: Goals/target dates to be developed during follow up appointment on 09/01/23.                Burlene Carpen, LCSW

## 2023-08-24 ENCOUNTER — Encounter: Payer: Self-pay | Admitting: Urology

## 2023-08-24 ENCOUNTER — Ambulatory Visit (INDEPENDENT_AMBULATORY_CARE_PROVIDER_SITE_OTHER): Admitting: Urology

## 2023-08-24 VITALS — BP 137/81 | HR 105 | Ht 72.0 in | Wt 318.0 lb

## 2023-08-24 DIAGNOSIS — N3941 Urge incontinence: Secondary | ICD-10-CM

## 2023-08-24 DIAGNOSIS — R350 Frequency of micturition: Secondary | ICD-10-CM | POA: Diagnosis not present

## 2023-08-24 DIAGNOSIS — R3915 Urgency of urination: Secondary | ICD-10-CM

## 2023-08-24 LAB — BLADDER SCAN AMB NON-IMAGING: Scan Result: 0

## 2023-08-24 MED ORDER — GEMTESA 75 MG PO TABS: 75.0000 mg | ORAL_TABLET | Freq: Every day | ORAL | Status: DC

## 2023-08-24 NOTE — Progress Notes (Signed)
 I, Maysun Jamey Mccallum, acting as a scribe for Geraline Knapp, MD., have documented all relevant documentation on the behalf of Geraline Knapp, MD, as directed by Geraline Knapp, MD while in the presence of Geraline Knapp, MD.  08/24/2023 4:03 PM   Scott Rice Oct 02, 1965 [id number]  Referring provider: Gabriel John, NP [address] [place],  [place] [address]  Chief Complaint  Patient presents with   Urinary Frequency    HPI: Scott Rice is a 58 y.o. male referred for evaluation of a urinary urgency.  6 month history of small volume urinary incontinence. He states his initial urge to void is not strong, however just before he gets to the bathroom, he will have intense urge with small volume urine loss. He states this happens approximately 50% of his voids throughout the day. No dysuria, and he voids with a good stream. He was given a trial of both tamsulosin  and tadalafil , which did not improve his symptoms.  Denies previous history of urologic problems.  PSA March 2025 was stable at 0.19  PSA trend   PSA  Latest Ref Rng 0.10 - 4.00 ng/ml  05/13/2020 0.21   06/10/2021 0.14   06/08/2022 0.22   06/01/2023 0.19      PMH: Past Medical History:  Diagnosis Date   Anxiety and depression    Cellulitis 02/24/2021   Cellulitis and abscess of neck 03/02/2021   Diabetic neuropathy [facility])    Essential hypertension    Frozen shoulder 07/20/2016   Hyperlipidemia    Type 2 diabetes mellitus (HCC)     Surgical History: History reviewed. No pertinent surgical history.  Home Medications:  Allergies as of 08/24/2023   No Known Allergies      Medication List        Accurate as of August 24, 2023  4:03 PM. If you have any questions, ask your nurse or doctor.          acetaminophen 500 MG tablet Commonly known as: TYLENOL Take 1,000 mg by mouth in the morning and at bedtime.   atorvastatin  20 MG tablet Commonly known as: LIPITOR TAKE 1 TABLET EVERY EVENING  FOR CHOLESTEROL   busPIRone  5 MG tablet Commonly known as: BUSPAR  Take 1 tablet (5 mg total) by mouth 2 (two) times daily. For anxiety   CALCIUM  600/VITAMIN D PO Take by mouth daily.   cyclobenzaprine  10 MG tablet Commonly known as: FLEXERIL  Take 0.5-1 tablets (5-10 mg total) by mouth at bedtime as needed for muscle spasms.   Diclofenac Sodium 1 % Crea Apply topically in the morning, at noon, and at bedtime.   famotidine 20 MG tablet Commonly known as: PEPCID Take 20 mg by mouth daily.   FLUoxetine  20 MG capsule Commonly known as: PROZAC  TAKE 1 CAPSULE DAILY FOR ANXIETY AND DEPRESSION. TAKE WITH 40 MG DOSE.   FLUoxetine  40 MG capsule Commonly known as: PROZAC  TAKE 1 CAPSULE EVERY DAY FOR ANXIETY   gabapentin  300 MG capsule Commonly known as: NEURONTIN  TAKE 1 CAPSULE TWICE DAILY FOR NERVE PAIN   Gemtesa  75 MG Tabs Generic drug: Vibegron  Take 1 tablet (75 mg total) by mouth daily. Started by: Geraline Knapp   glipiZIDE  10 MG tablet Commonly known as: GLUCOTROL  TAKE 1 TABLET TWICE DAILY BEFORE MEALS FOR DIABETES   INSULIN  SYRINGE 1CC/30GX5/16 30G X 5/16 1 ML Misc Inject twice daily with insulin .   levocetirizine 5 MG tablet Commonly known as: XYZAL  Take 1 tablet (5  mg total) by mouth every evening. For allergies   lisinopril  10 MG tablet Commonly known as: ZESTRIL  TAKE 1 TABLET EVERY DAY FOR BLOOD PRESSURE   meloxicam  7.5 MG tablet Commonly known as: MOBIC  Take 1 tablet (7.5 mg total) by mouth in the morning and at bedtime. As needed for back pain.   metFORMIN  1000 MG tablet Commonly known as: GLUCOPHAGE  TAKE 1 TABLET TWICE DAILY WITH MEALS FOR DIABETES   mupirocin  ointment 2 % Commonly known as: BACTROBAN  APPLY 1 APPLICATION TOPICALLY 2 (TWO) TIMES DAILY.   NovoLIN  70/30 (70-30) 100 UNIT/ML injection Generic drug: insulin  NPH-regular Human INJECT 40 UNITS IN THE MORNING AND 35 UNITS AT NIGHT FOR DIABETES.   Ozempic  (0.25 or 0.5 MG/DOSE) 2 MG/3ML  Sopn Generic drug: Semaglutide (0.25 or 0.5MG /DOS) Inject 0.25 mg into the skin once weekly for 4 weeks, then increase to 0.5 mg once weekly thereafter for diabetes.   SUPER B COMPLEX PO Take by mouth 2 (two) times daily.   traZODone  50 MG tablet Commonly known as: DESYREL  TAKE 1/2 TO 1 TABLET  BY MOUTH AT BEDTIME AS NEEDED FOR SLEEP.        Allergies: No Known Allergies  Social History:  reports that he has quit smoking. He has never used smokeless tobacco. He reports that he does not drink alcohol and does not use drugs.   Physical Exam: BP 137/81   Pulse (!) 105   Ht 6' (1.829 m)   Wt (!) 318 lb (144.2 kg)   BMI 43.13 kg/m   Constitutional:  Alert and oriented, No acute distress. HEENT: [place] AT Respiratory: Normal respiratory effort, no increased work of breathing. Psychiatric: Normal mood and affect.   Assessment & Plan:    1. Urinary urgency with incontinence Small volume urge incontinence He was unable to void on arrival to the office today and a bladder scan was performed, which showed an estimated volume of 0 mL Trial Gemtesa  75 mg daily- samples given.  He will send a MyChart message regarding efficacy of this medication.  I have reviewed the above documentation for accuracy and completeness, and I agree with the above.   Geraline Knapp, MD  [facility] [address] [place], [place] [address] [phone]

## 2023-08-29 ENCOUNTER — Other Ambulatory Visit: Payer: Self-pay | Admitting: Primary Care

## 2023-08-29 DIAGNOSIS — E785 Hyperlipidemia, unspecified: Secondary | ICD-10-CM

## 2023-08-29 DIAGNOSIS — I10 Essential (primary) hypertension: Secondary | ICD-10-CM

## 2023-08-29 DIAGNOSIS — Z1211 Encounter for screening for malignant neoplasm of colon: Secondary | ICD-10-CM | POA: Diagnosis not present

## 2023-08-29 DIAGNOSIS — F419 Anxiety disorder, unspecified: Secondary | ICD-10-CM

## 2023-08-29 DIAGNOSIS — E119 Type 2 diabetes mellitus without complications: Secondary | ICD-10-CM

## 2023-09-01 ENCOUNTER — Encounter: Payer: Self-pay | Admitting: Clinical

## 2023-09-01 ENCOUNTER — Ambulatory Visit: Admitting: Clinical

## 2023-09-01 DIAGNOSIS — F411 Generalized anxiety disorder: Secondary | ICD-10-CM | POA: Diagnosis not present

## 2023-09-01 DIAGNOSIS — F331 Major depressive disorder, recurrent, moderate: Secondary | ICD-10-CM

## 2023-09-01 NOTE — Progress Notes (Signed)
 [facility] Counselor/Therapist Progress Note  Patient ID: Scott Rice, MRN: [id number]    Date: 09/01/23  Time Spent: 12:37  pm - 1:33 pm : 56 Minutes  Treatment Type: Individual Therapy.  Reported Symptoms: anxiety/panic at times  Mental Status Exam: Appearance:  Neat and Well Groomed     Behavior: Appropriate  Motor: Normal  Speech/Language:  Clear and Coherent and Normal Rate  Affect: Appropriate  Mood: normal  Thought process: normal  Thought content:   WNL  Sensory/Perceptual disturbances:   WNL  Orientation: oriented to person, place, time/date, and situation  Attention: Good  Concentration: Good  Memory: WNL  Fund of knowledge:  Good  Insight:   Good  Judgment:  Good  Impulse Control: Good   Risk Assessment: Danger to Self:  No Patient denied current suicidal ideation  Self-injurious Behavior: No Danger to Others: No Patient denied current homicidal ideation Duty to Warn:no Physical Aggression / Violence:No  Access to Firearms a concern: No  Gang Involvement:No   Subjective:  Patient stated, I've been doing pretty good in response to events since last session. Patient reported patient was recently approved to see an eye specialist through [facility]. Patient stated, we've hired a lot more people at work and I've realized my claustrophobia is real, work has actually calmed down and management has made some changes, I don't dread going in on Friday as bad as I use to. Patient stated, my mood has been better. Patient stated, I want acceptance in response to potential goals for therapy. Patient reported feeling patient fixates on situations. Patient stated, I would like to be able to without using any drugs (medications) be able to shut my mind down and go to sleep in response to potential goals for therapy.   Interventions: Motivational Interviewing. Clinician conducted session in person at clinician's office at [facility]. Reviewed  events since last session and assessed for changes. Clinician utilized motivational interviewing to explore potential goals for therapy. Clinician utilized a task centered approach in collaboration with patient to develop goals for therapy. Patient participated in development of goals and agreed to goals for therapy.   Collaboration of Care: Other not required at this time  Diagnosis:  Major depressive disorder, recurrent episode, moderate (HCC)  Generalized anxiety disorder with panic attacks   Plan: Patient is to utilize [employer], thought re-framing, relaxation techniques, mindfulness, behavioral activation, and coping strategies to decrease symptoms associated with their diagnosis. Frequency: bi-weekly  Modality: individual     Long-term goal:   Reduce overall level, frequency, and intensity of the feelings of depression, anxiety, and panic as evidenced by decrease in difficulty falling asleep and staying asleep, feeling nervous, panic attacks, anticipatory worry, feeling on edge, decreased concentration, muscle tension, loss of interest, depressed mood, decreased energy, staying in bed when mood is depressed and fatigue from 7 days/week to 0 to 1 days/week per patient's report for at least 3 consecutive months. Target Date: 08/31/24  Progress: established 09/01/23   Short-term goal:  Identify, challenge, and replace negative thought patterns/distortions and negative self talk that contribute to feelings of depression, anxiety, and anger with positive thoughts, beliefs, and positive self talk per patient's report Target Date: 08/31/24  Progress: established 09/01/23   Increase use of coping skills such as, breathing exercises, imagery, mindfulness, progressive muscle relaxation from 1 to 7 times a week Target Date: 08/31/24  Progress: established 09/01/23   Increase the quality of patient's sleep as evidence by decrease in difficulty falling  asleep and staying asleep from  0 days per week to 6-7 days per week Target Date: 08/31/24  Progress: established 09/01/23   Increase motivation/energy as evidence by increase in engagement in positive activities and completion of tasks to 3 times per week Target Date: 08/31/24  Progress: established 09/01/23   Increase acceptance of situations patient is unable to change Target Date: 08/31/24  Progress: established 09/01/23                      Burlene Carpen, LCSW

## 2023-09-03 ENCOUNTER — Other Ambulatory Visit: Payer: Self-pay | Admitting: Primary Care

## 2023-09-03 DIAGNOSIS — E1141 Type 2 diabetes mellitus with diabetic mononeuropathy: Secondary | ICD-10-CM

## 2023-09-03 DIAGNOSIS — F32A Depression, unspecified: Secondary | ICD-10-CM

## 2023-09-04 ENCOUNTER — Ambulatory Visit: Payer: Self-pay | Admitting: Primary Care

## 2023-09-04 LAB — COLOGUARD: COLOGUARD: NEGATIVE

## 2023-09-06 ENCOUNTER — Ambulatory Visit: Payer: Self-pay | Admitting: Primary Care

## 2023-09-06 ENCOUNTER — Encounter: Payer: Self-pay | Admitting: Primary Care

## 2023-09-06 ENCOUNTER — Ambulatory Visit (INDEPENDENT_AMBULATORY_CARE_PROVIDER_SITE_OTHER): Admitting: Primary Care

## 2023-09-06 VITALS — BP 126/68 | HR 89 | Temp 97.2°F | Ht 72.0 in | Wt 321.0 lb

## 2023-09-06 DIAGNOSIS — E1140 Type 2 diabetes mellitus with diabetic neuropathy, unspecified: Secondary | ICD-10-CM

## 2023-09-06 DIAGNOSIS — E1165 Type 2 diabetes mellitus with hyperglycemia: Secondary | ICD-10-CM | POA: Diagnosis not present

## 2023-09-06 DIAGNOSIS — Z794 Long term (current) use of insulin: Secondary | ICD-10-CM

## 2023-09-06 LAB — POCT GLYCOSYLATED HEMOGLOBIN (HGB A1C): Hemoglobin A1C: 6.9 % — AB (ref 4.0–5.6)

## 2023-09-06 MED ORDER — NOVOLIN 70/30 (70-30) 100 UNIT/ML ~~LOC~~ SUSP: SUBCUTANEOUS | Status: DC

## 2023-09-06 MED ORDER — OZEMPIC (0.25 OR 0.5 MG/DOSE) 2 MG/3ML ~~LOC~~ SOPN: 0.5000 mg | PEN_INJECTOR | SUBCUTANEOUS | 0 refills | Status: DC

## 2023-09-06 NOTE — Patient Instructions (Signed)
 Continue Ozempic  0.5 mg weekly for diabetes.  Continue Novolin  70/30 40 units in a.m.  Reduce to 30 units in p.m.  Continue metformin  and glipizide  as prescribed.  Please schedule a follow up visit for 3 months.  It was a pleasure to see you today!

## 2023-09-06 NOTE — Progress Notes (Signed)
 Subjective:    Patient ID: Scott Rice, male    DOB: [birth year], 58 y.o.   MRN: [id number]  HPI  Scott Rice is a very pleasant 58 y.o. male with a history of pretension, type 2 diabetes, diabetic retinopathy, hyperlipidemia who presents today for follow-up of diabetes.  Current medications include: Metformin  1000 mg twice daily, Ozempic  0.5 mg weekly, Novolin  70/30 40 units in a.m. and 35 units in p.m., glipizide  10 mg twice daily.  He does experience nausea and diarrhea for a few days after his dose of Ozempic .   He is checking his blood glucose 2 times daily and is getting readings of:  AM fasting: low 100s Bedtime: 150-160 Lowest reading : 52 last night.   Last A1C: 8.7 in March 2025, 6.9 today Last Eye Exam: UTD Last Foot Exam: UTD Pneumonia Vaccination: Completed last in 2018 Urine Microalbumin: Up-to-date Statin: Atorvastatin   Dietary changes since last visit: No more Twizzlers. Smaller portion sizes.    Exercise: Increased physical activity.   Wt Readings from Last 3 Encounters:  09/06/23 (!) 321 lb (145.6 kg)  08/24/23 (!) 318 lb (144.2 kg)  07/06/23 (!) 321 lb (145.6 kg)        Review of Systems  Respiratory:  Negative for shortness of breath.   Cardiovascular:  Negative for chest pain.  Gastrointestinal:  Positive for diarrhea and nausea. Negative for abdominal pain.       With Ozempic  dose  Neurological:  Positive for numbness.         Past Medical History:  Diagnosis Date   Anxiety and depression    Cellulitis 02/24/2021   Cellulitis and abscess of neck 03/02/2021   Diabetic neuropathy [name]    Essential hypertension    Frozen shoulder 07/20/2016   Hyperlipidemia    Type 2 diabetes mellitus (HCC)     Social History   Socioeconomic History   Marital status: Married    Spouse name: Not on file   Number of children: Not on file   Years of education: Not on file   Highest education level: Bachelor's degree (e.g., BA, AB, BS)   Occupational History   Not on file  Tobacco Use   Smoking status: Former   Smokeless tobacco: Never   Tobacco comments:    quit 40 years ago  Substance and Sexual Activity   Alcohol use: No   Drug use: No   Sexual activity: Not on file  Other Topics Concern   Not on file  Social History Narrative   Married.   No children.   Works as an Event organiser.   Enjoys watching movies, spending time with family.    Social Drivers of Health   Financial Resource Strain: Medium Risk (09/04/2023)   Overall Financial Resource Strain (CARDIA)    Difficulty of Paying Living Expenses: Somewhat hard  Food Insecurity: No Food Insecurity (09/04/2023)   Hunger Vital Sign    Worried About Running Out of Food in the Last Year: Never true    Ran Out of Food in the Last Year: Never true  Transportation Needs: No Transportation Needs (09/04/2023)   PRAPARE - Administrator, Civil Service (Medical): No    Lack of Transportation (Non-Medical): No  Physical Activity: Insufficiently Active (09/04/2023)   Exercise Vital Sign    Days of Exercise per Week: 2 days    Minutes of Exercise per Session: 30 min  Stress: Stress Concern Present (09/04/2023)   [employer] of Occupational Health -  Occupational Stress Questionnaire    Feeling of Stress: To some extent  Social Connections: Moderately Isolated (09/04/2023)   Social Connection and Isolation Panel    Frequency of Communication with Friends and Family: More than three times a week    Frequency of Social Gatherings with Friends and Family: Once a week    Attends Religious Services: Never    Database administrator or Organizations: No    Attends Engineer, structural: Not on file    Marital Status: Married  Catering manager Violence: Not At Risk (06/09/2023)   Humiliation, Afraid, Rape, and Kick questionnaire    Fear of Current or Ex-Partner: No    Emotionally Abused: No    Physically Abused: No    Sexually Abused: No     History reviewed. No pertinent surgical history.  History reviewed. No pertinent family history.  No Known Allergies  Current Outpatient Medications on File Prior to Visit  Medication Sig Dispense Refill   acetaminophen (TYLENOL) 500 MG tablet Take 1,000 mg by mouth in the morning and at bedtime.     atorvastatin  (LIPITOR) 20 MG tablet TAKE 1 TABLET EVERY EVENING FOR CHOLESTEROL 90 tablet 2   B Complex-C (SUPER B COMPLEX PO) Take by mouth 2 (two) times daily.     busPIRone  (BUSPAR ) 5 MG tablet TAKE 1 TABLET TWICE DAILY FOR ANXIETY 180 tablet 2   Calcium  Carbonate-Vitamin D (CALCIUM  600/VITAMIN D PO) Take by mouth daily.     cyclobenzaprine  (FLEXERIL ) 10 MG tablet Take 0.5-1 tablets (5-10 mg total) by mouth at bedtime as needed for muscle spasms. 30 tablet 1   FLUoxetine  (PROZAC ) 20 MG capsule TAKE 1 CAPSULE DAILY FOR ANXIETY AND DEPRESSION. TAKE WITH 40 MG DOSE. 90 capsule 2   FLUoxetine  (PROZAC ) 40 MG capsule TAKE 1 CAPSULE EVERY DAY FOR ANXIETY 90 capsule 2   gabapentin  (NEURONTIN ) 300 MG capsule TAKE 1 CAPSULE TWICE DAILY FOR NERVE PAIN 180 capsule 2   glipiZIDE  (GLUCOTROL ) 10 MG tablet TAKE 1 TABLET TWICE DAILY BEFORE MEALS FOR DIABETES 180 tablet 1   Insulin  Syringe-Needle U-100 (INSULIN  SYRINGE 1CC/30GX5/16) 30G X 5/16 1 ML MISC Inject twice daily with insulin . 200 each 3   levocetirizine (XYZAL ) 5 MG tablet Take 1 tablet (5 mg total) by mouth every evening. For allergies 90 tablet 3   lisinopril  (ZESTRIL ) 10 MG tablet TAKE 1 TABLET EVERY DAY FOR BLOOD PRESSURE 90 tablet 2   metFORMIN  (GLUCOPHAGE ) 1000 MG tablet TAKE 1 TABLET TWICE DAILY WITH MEALS FOR DIABETES 180 tablet 1   mupirocin  ointment (BACTROBAN ) 2 % APPLY 1 APPLICATION TOPICALLY 2 (TWO) TIMES DAILY. 22 g 0   traZODone  (DESYREL ) 50 MG tablet TAKE 1/2 TO 1 TABLET  BY MOUTH AT BEDTIME AS NEEDED FOR SLEEP. 90 tablet 0   Vibegron  (GEMTESA ) 75 MG TABS Take 1 tablet (75 mg total) by mouth daily.     Diclofenac Sodium 1 %  CREA Apply topically in the morning, at noon, and at bedtime. (Patient not taking: Reported on 09/06/2023)     famotidine (PEPCID) 20 MG tablet Take 20 mg by mouth daily. (Patient not taking: Reported on 09/06/2023)     meloxicam  (MOBIC ) 7.5 MG tablet Take 1 tablet (7.5 mg total) by mouth in the morning and at bedtime. As needed for back pain. (Patient not taking: Reported on 09/06/2023) 180 tablet 0   No current facility-administered medications on file prior to visit.    BP 126/68   Pulse 89  Temp (!) 97.2 F (36.2 C) (Temporal)   Ht 6' (1.829 m)   Wt (!) 321 lb (145.6 kg)   SpO2 98%   BMI 43.54 kg/m  Objective:   Physical Exam  Cardiovascular:     Rate and Rhythm: Normal rate and regular rhythm.  Pulmonary:     Effort: Pulmonary effort is normal.     Breath sounds: Normal breath sounds.   Musculoskeletal:     Cervical back: Neck supple.   Skin:    General: Skin is warm and dry.   Neurological:     Mental Status: He is alert and oriented to person, place, and time.   Psychiatric:        Mood and Affect: Mood normal.           Assessment & Plan:  Type 2 diabetes mellitus with hyperglycemia, with long-term current use of insulin  (HCC) Assessment & Plan: Improved with A1C of 6.9 today!  Continue Ozempic  0.5 mg once weekly for diabetes.  Discussed the option for dose increase, he declines given current side effects.  He would like to work on lifestyle changes.  This is very reasonable.  Reduce Novolin  70/30 to 30 units in PM, continue 40 units in AM. Continue glipizide  10 mg twice daily, metformin  1000 mg twice daily.  He will update if he experiences more episodes of hypoglycemia  Follow-up in 3 months.  Consider dose increase of Ozempic  at that time.  Orders: -     POCT glycosylated hemoglobin (Hb A1C) -     Ozempic  (0.25 or 0.5 MG/DOSE); Inject 0.5 mg into the skin once a week. for diabetes.  Dispense: 9 mL; Refill: 0  Type 2 diabetes mellitus with diabetic  neuropathy, with long-term current use of insulin  (HCC) -     NovoLIN  70/30; Inject 40 units in the morning and 30 units at night for diabetes.        Scott Raudenbush K Lamone Ferrelli, NP

## 2023-09-06 NOTE — Assessment & Plan Note (Signed)
 Improved with A1C of 6.9 today!  Continue Ozempic  0.5 mg once weekly for diabetes.  Discussed the option for dose increase, he declines given current side effects.  He would like to work on lifestyle changes.  This is very reasonable.  Reduce Novolin  70/30 to 30 units in PM, continue 40 units in AM. Continue glipizide  10 mg twice daily, metformin  1000 mg twice daily.  He will update if he experiences more episodes of hypoglycemia  Follow-up in 3 months.  Consider dose increase of Ozempic  at that time.

## 2023-09-12 ENCOUNTER — Encounter: Payer: Self-pay | Admitting: *Deleted

## 2023-09-18 ENCOUNTER — Encounter: Payer: Self-pay | Admitting: Urology

## 2023-09-22 ENCOUNTER — Ambulatory Visit: Admitting: Clinical

## 2023-09-22 ENCOUNTER — Other Ambulatory Visit: Payer: Self-pay | Admitting: Urology

## 2023-09-22 DIAGNOSIS — F411 Generalized anxiety disorder: Secondary | ICD-10-CM

## 2023-09-22 DIAGNOSIS — F331 Major depressive disorder, recurrent, moderate: Secondary | ICD-10-CM

## 2023-09-22 MED ORDER — SOLIFENACIN SUCCINATE 10 MG PO TABS
10.0000 mg | ORAL_TABLET | Freq: Every day | ORAL | 0 refills | Status: DC
Start: 2023-09-22 — End: 2023-11-23

## 2023-09-22 NOTE — Progress Notes (Signed)
   Scott Barthel, LCSW

## 2023-09-22 NOTE — Progress Notes (Signed)
 [facility] Counselor/Therapist Progress Note  Patient ID: Scott Rice, MRN: [id number],    Date: 09/22/2023  Time Spent: 2:31pm - 3:26pm : 55 minutes   Treatment Type: Individual Therapy  Reported Symptoms: Patient reported recent feelings of anger  Mental Status Exam: Appearance:  Casual     Behavior: Appropriate  Motor: Normal  Speech/Language:  Clear and Coherent and Normal Rate  Affect: Appropriate  Mood: normal  Thought process: normal  Thought content:   WNL  Sensory/Perceptual disturbances:   WNL  Orientation: oriented to person, place, time/date, and situation  Attention: Good  Concentration: Good  Memory: WNL  Fund of knowledge:  Good  Insight:   Good  Judgment:  Good  Impulse Control: Good   Risk Assessment: Danger to Self:  No  Patient denied current suicidal ideation  Self-injurious Behavior: No Danger to Others: No Patient denied current homicidal ideation Duty to Warn:no Physical Aggression / Violence:No  Access to Firearms a concern: No  Gang Involvement:No   Subjective: Patient stated, pretty good, kind of hectic in response to events since last session. Patient stated, Its been pretty good in response to mood since last session.  Patient reported previous thoughts of harming an individual who was yelling at patient's friend for driving fast. Patient stated, it made her (friend) so upset she was crying. Patient stated, I'd like to think no in response to clinician's inquiry regarding patient's intent. Patient reported feeling angry in response to the situation. Patient reported patient's nickname was the dark side and patient reported patient obtained the nickname due to patient's history of anger. Patient stated, It takes a lot to get me going but when I'm there I'm there in reference to anger. Patient stated, I know better, ill do the math in my head this is not worth it, I can relieve this some other way in response to  factors that prevented patient from acting on feelings of anger. Patient stated,  I have thrown punches. Patient stated, I don't like it when people pick on other people because I use to be picked on. Patient reported symptoms of depression started after patient obtained his degree and was unable to secure a job pertaining to patient's degree and after patient developed symptoms of IBS. Patient reported symptoms of IBS are triggered by patient's emotions. Patient reported patient's wife being diagnosed with breast cancer was a trigger for anxiety. Patient stated, I didn't get anxiety until COVID, I was getting so anxious about going into work and reported first panic attack occurred during COVID. Patient stated, I've actually felt better last couple of months in reference to mood.   Interventions: Cognitive Behavioral Therapy. Clinician conducted session in person at clinician's office at [facility]. Reviewed events since last session and assessed for changes. Discussed recent feelings of anger, recent trigger for anger, and patient's response. Explored and identified factors that prevented patient from acting on feelings of anger. Explored patient's previous experiences and triggers as it relates to anger, depression, and anxiety.    Collaboration of Care: Other not required at this time   Diagnosis:  Major depressive disorder, recurrent episode, moderate (HCC)   Generalized anxiety disorder with panic attacks     Plan: Patient is to utilize [employer] Therapy, thought re-framing, relaxation techniques, mindfulness, behavioral activation, and coping strategies to decrease symptoms associated with their diagnosis. Frequency: bi-weekly  Modality: individual      Long-term goal:   Reduce overall level, frequency, and intensity of the feelings  of depression, anxiety, and panic as evidenced by decrease in difficulty falling asleep and staying asleep, feeling nervous, panic  attacks, anticipatory worry, feeling on edge, decreased concentration, muscle tension, loss of interest, depressed mood, decreased energy, staying in bed when mood is depressed and fatigue from 7 days/week to 0 to 1 days/week per patient's report for at least 3 consecutive months. Target Date: 08/31/24  Progress: progressing    Short-term goal:  Identify, challenge, and replace negative thought patterns/distortions and negative self talk that contribute to feelings of depression, anxiety, and anger with positive thoughts, beliefs, and positive self talk per patient's report Target Date: 08/31/24  Progress: progressing    Increase use of coping skills such as, breathing exercises, imagery, mindfulness, progressive muscle relaxation from 1 to 7 times a week Target Date: 08/31/24  Progress: progressing    Increase the quality of patient's sleep as evidence by decrease in difficulty falling asleep and staying asleep from 0 days per week to 6-7 days per week Target Date: 08/31/24  Progress: progressing    Increase motivation/energy as evidence by increase in engagement in positive activities and completion of tasks to 3 times per week Target Date: 08/31/24  Progress: progressing    Increase acceptance of situations patient is unable to change Target Date: 08/31/24  Progress: progressing                        Darice Seats, LCSW

## 2023-09-26 ENCOUNTER — Encounter: Payer: Self-pay | Admitting: Primary Care

## 2023-10-25 ENCOUNTER — Ambulatory Visit: Admitting: Urology

## 2023-11-02 ENCOUNTER — Ambulatory Visit: Admitting: Clinical

## 2023-11-02 ENCOUNTER — Other Ambulatory Visit: Payer: Self-pay | Admitting: Primary Care

## 2023-11-02 DIAGNOSIS — E119 Type 2 diabetes mellitus without complications: Secondary | ICD-10-CM

## 2023-11-15 ENCOUNTER — Ambulatory Visit: Admitting: Clinical

## 2023-11-17 DIAGNOSIS — Z961 Presence of intraocular lens: Secondary | ICD-10-CM | POA: Diagnosis not present

## 2023-11-17 DIAGNOSIS — H43813 Vitreous degeneration, bilateral: Secondary | ICD-10-CM | POA: Diagnosis not present

## 2023-11-17 DIAGNOSIS — E113513 Type 2 diabetes mellitus with proliferative diabetic retinopathy with macular edema, bilateral: Secondary | ICD-10-CM | POA: Diagnosis not present

## 2023-11-23 ENCOUNTER — Ambulatory Visit: Admitting: Urology

## 2023-11-23 ENCOUNTER — Encounter: Payer: Self-pay | Admitting: Urology

## 2023-11-23 VITALS — BP 118/76 | HR 94 | Ht 72.0 in | Wt 350.0 lb

## 2023-11-23 DIAGNOSIS — N3941 Urge incontinence: Secondary | ICD-10-CM | POA: Diagnosis not present

## 2023-11-23 DIAGNOSIS — R3915 Urgency of urination: Secondary | ICD-10-CM

## 2023-11-23 LAB — BLADDER SCAN AMB NON-IMAGING

## 2023-11-23 MED ORDER — SOLIFENACIN SUCCINATE 10 MG PO TABS: 10.0000 mg | ORAL_TABLET | Freq: Every day | ORAL | 3 refills | Status: AC

## 2023-11-23 NOTE — Progress Notes (Signed)
 11/23/2023 2:02 PM   Scott Rice [birth year] [id number]  Referring provider: Gretta Comer POUR, NP [address] [place],  [place] [address]  Chief Complaint  Patient presents with   Follow-up    HPI: Scott Rice is a 58 y.o. male who presents for follow-up visit.  Initially seen 08/24/2023 for bothersome urinary frequency, urgency with small-volume urge incontinence x 6 months No improvement with tamsulosin  or tadalafil  Was given a trial of Gemtesa  75 mg daily and he noted resolution of his voiding symptoms however this medication was going to be cost prohibitive. He was given a trial of solifenacin  which he states has significantly improved his symptoms.  He is having mild dry mouth/constipation but tolerable   PMH: Past Medical History:  Diagnosis Date   Anxiety and depression    Cellulitis 02/24/2021   Cellulitis and abscess of neck 03/02/2021   Diabetic neuropathy [facility])    Essential hypertension    Frozen shoulder 07/20/2016   Hyperlipidemia    Type 2 diabetes mellitus (HCC)     Surgical History: No past surgical history on file.  Home Medications:  Allergies as of 11/23/2023       Reactions   Mixed Ragweed Cough, Other (See Comments)        Medication List        Accurate as of November 23, 2023  2:02 PM. If you have any questions, ask your nurse or doctor.          acetaminophen 500 MG tablet Commonly known as: TYLENOL Take 1,000 mg by mouth in the morning and at bedtime.   atorvastatin  20 MG tablet Commonly known as: LIPITOR TAKE 1 TABLET EVERY EVENING FOR CHOLESTEROL   busPIRone  5 MG tablet Commonly known as: BUSPAR  TAKE 1 TABLET TWICE DAILY FOR ANXIETY   CALCIUM  600/VITAMIN D PO Take by mouth daily.   cyclobenzaprine  10 MG tablet Commonly known as: FLEXERIL  Take 0.5-1 tablets (5-10 mg total) by mouth at bedtime as needed for muscle spasms.   Diclofenac Sodium 1 % Crea Apply topically in the morning, at noon, and at  bedtime.   famotidine 20 MG tablet Commonly known as: PEPCID Take 20 mg by mouth daily.   FLUoxetine  40 MG capsule Commonly known as: PROZAC  TAKE 1 CAPSULE EVERY DAY FOR ANXIETY   FLUoxetine  20 MG capsule Commonly known as: PROZAC  TAKE 1 CAPSULE DAILY FOR ANXIETY AND DEPRESSION. TAKE WITH 40 MG DOSE.   gabapentin  300 MG capsule Commonly known as: NEURONTIN  TAKE 1 CAPSULE TWICE DAILY FOR NERVE PAIN   glipiZIDE  10 MG tablet Commonly known as: GLUCOTROL  TAKE 1 TABLET TWICE DAILY BEFORE MEALS FOR DIABETES   INSULIN  SYRINGE 1CC/30GX5/16 30G X 5/16 1 ML Misc Inject twice daily with insulin .   levocetirizine 5 MG tablet Commonly known as: XYZAL  Take 1 tablet (5 mg total) by mouth every evening. For allergies   lisinopril  10 MG tablet Commonly known as: ZESTRIL  TAKE 1 TABLET EVERY DAY FOR BLOOD PRESSURE   metFORMIN  1000 MG tablet Commonly known as: GLUCOPHAGE  TAKE 1 TABLET TWICE DAILY WITH MEALS FOR DIABETES   mupirocin  ointment 2 % Commonly known as: BACTROBAN  APPLY 1 APPLICATION TOPICALLY 2 (TWO) TIMES DAILY.   NovoLIN  70/30 (70-30) 100 UNIT/ML injection Generic drug: insulin  NPH-regular Human Inject 40 units in the morning and 30 units at night for diabetes.   Ozempic  (0.25 or 0.5 MG/DOSE) 2 MG/3ML Sopn Generic drug: Semaglutide (0.25 or 0.5MG /DOS) Inject 0.5 mg into the skin once a week. for  diabetes.   solifenacin  10 MG tablet Commonly known as: VESICARE  Take 1 tablet (10 mg total) by mouth daily.   SUPER B COMPLEX PO Take by mouth 2 (two) times daily.   traZODone  50 MG tablet Commonly known as: DESYREL  TAKE 1/2 TO 1 TABLET  BY MOUTH AT BEDTIME AS NEEDED FOR SLEEP.        Allergies:  Allergies  Allergen Reactions   Mixed Ragweed Cough and Other (See Comments)    Family History: No family history on file.  Social History:  reports that he has quit smoking. He has never used smokeless tobacco. He reports that he does not drink alcohol and does not  use drugs.   Physical Exam: BP 118/76   Pulse 94   Ht 6' (1.829 m)   Wt (!) 350 lb (158.8 kg)   BMI 47.47 kg/m   Constitutional:  Alert, No acute distress. HEENT:  AT Respiratory: Normal respiratory effort, no increased work of breathing. Psychiatric: Normal mood and affect.   Assessment & Plan:    1. Urinary urgency with urge incontinence Beta 3 agonist not covered Doing well on solifenacin -refill sent to pharmacy PVR today 31 mL 1 year follow-up with PVR   Scott JAYSON Barba, MD  [facility] [address] [place], [place] [address] [phone]

## 2023-11-30 ENCOUNTER — Ambulatory Visit: Admitting: Clinical

## 2023-11-30 DIAGNOSIS — F411 Generalized anxiety disorder: Secondary | ICD-10-CM | POA: Diagnosis not present

## 2023-11-30 DIAGNOSIS — F331 Major depressive disorder, recurrent, moderate: Secondary | ICD-10-CM

## 2023-11-30 NOTE — Progress Notes (Signed)
   Scott Seats, LCSW

## 2023-11-30 NOTE — Progress Notes (Signed)
 [facility] Counselor/Therapist Progress Note  Patient ID: Scott Roggenkamp, MRN: [id number],    Date: 11/30/2023  Time Spent: 1:34pm - 2:32pm : 58 minutes  Treatment Type: Individual Therapy  Reported Symptoms: none reported  Mental Status Exam: Appearance:  Neat and Well Groomed     Behavior: Appropriate  Motor: Normal  Speech/Language:  Clear and Coherent and Normal Rate  Affect: Appropriate  Mood: normal  Thought process: normal  Thought content:   WNL  Sensory/Perceptual disturbances:   Headache   Orientation: oriented to person, place, time/date, and situation  Attention: Good  Concentration: Good  Memory: WNL  Fund of knowledge:  Good  Insight:   Good  Judgment:  Good  Impulse Control: Good   Risk Assessment: Danger to Self:  No Patient denied current suicidal ideation  Self-injurious Behavior: No Danger to Others: No Patient denied current homicidal ideation Duty to Warn:no Physical Aggression / Violence:No  Access to Firearms a concern: No  Gang Involvement:No   Subjective: Patient stated, everything's been going pretty good in response to events since last session. Patient stated, it feels like we're treading water in reference to treatment of eye condition. Patient stated, my eyes aren't getting any better, aren't getting any worse. Patient stated, its been kind of a roller coaster, my uncle passed away.  Patient reported recent discovery of household repairs and mice in the home. Patient stated, that's been kind of drawing my focus for the last two weeks in reference wife's response to household repairs. Patient stated, I do not like to sit around and be passive I like to be proactive for her (wife). Patient stated, I haven't really focused on anything in reference to mood. Patient reported experiencing headaches due to allergies. Patient reported watching movies, baseball cards, you tube videos on baseball cards, watching baseball  games, deep breathing, rubbing fingers together and counting, and mindfulness eating exercise are coping strategies patient utilizes in response to stressors. Patient stated, baseball cards have been my coping mechanism since I was 5. Patient reported baseball cards are calming and promote positive memories. Patient stated, I like it in response to grounding exercises discussed. Patient stated, I feel pretty good today.  Interventions: Cognitive Behavioral Therapy. Clinician conducted session in person at clinician's office at [facility]. Reviewed events since last session and assessed for changes. Explored patient's statement, it's been kind of a roller coaster. Discussed current stressors. Reviewed resources for home repairs in [employer] and provided information. Explored and identified coping strategies patient utilizes in response to stressors. Provided psycho education related to grounding exercises (categories, counting).   Collaboration of Care: Other not required at this time   Diagnosis:  Major depressive disorder, recurrent episode, moderate (HCC)   Generalized anxiety disorder with panic attacks     Plan: Patient is to utilize [employer], thought re-framing, relaxation techniques, mindfulness, behavioral activation, and coping strategies to decrease symptoms associated with their diagnosis. Frequency: bi-weekly  Modality: individual      Long-term goal:   Reduce overall level, frequency, and intensity of the feelings of depression, anxiety, and panic as evidenced by decrease in difficulty falling asleep and staying asleep, feeling nervous, panic attacks, anticipatory worry, feeling on edge, decreased concentration, muscle tension, loss of interest, depressed mood, decreased energy, staying in bed when mood is depressed and fatigue from 7 days/week to 0 to 1 days/week per patient's report for at least 3 consecutive months. Target Date: 08/31/24   Progress: progressing  Short-term goal:  Identify, challenge, and replace negative thought patterns/distortions and negative self talk that contribute to feelings of depression, anxiety, and anger with positive thoughts, beliefs, and positive self talk per patient's report Target Date: 08/31/24  Progress: progressing    Increase use of coping skills such as, breathing exercises, imagery, mindfulness, progressive muscle relaxation from 1 to 7 times a week Target Date: 08/31/24  Progress: progressing    Increase the quality of patient's sleep as evidence by decrease in difficulty falling asleep and staying asleep from 0 days per week to 6-7 days per week Target Date: 08/31/24  Progress: progressing    Increase motivation/energy as evidence by increase in engagement in positive activities and completion of tasks to 3 times per week Target Date: 08/31/24  Progress: progressing    Increase acceptance of situations patient is unable to change Target Date: 08/31/24  Progress: progressing                      Darice Seats, LCSW

## 2023-12-07 ENCOUNTER — Ambulatory Visit: Payer: Self-pay | Admitting: Primary Care

## 2023-12-07 ENCOUNTER — Ambulatory Visit (INDEPENDENT_AMBULATORY_CARE_PROVIDER_SITE_OTHER): Admitting: Primary Care

## 2023-12-07 ENCOUNTER — Encounter: Payer: Self-pay | Admitting: Primary Care

## 2023-12-07 ENCOUNTER — Other Ambulatory Visit: Payer: Self-pay | Admitting: Medical Genetics

## 2023-12-07 VITALS — BP 134/72 | HR 98 | Temp 98.3°F | Ht 72.0 in | Wt 319.0 lb

## 2023-12-07 DIAGNOSIS — E1140 Type 2 diabetes mellitus with diabetic neuropathy, unspecified: Secondary | ICD-10-CM | POA: Diagnosis not present

## 2023-12-07 DIAGNOSIS — E1165 Type 2 diabetes mellitus with hyperglycemia: Secondary | ICD-10-CM

## 2023-12-07 DIAGNOSIS — Z23 Encounter for immunization: Secondary | ICD-10-CM

## 2023-12-07 DIAGNOSIS — Z794 Long term (current) use of insulin: Secondary | ICD-10-CM | POA: Diagnosis not present

## 2023-12-07 LAB — POCT GLYCOSYLATED HEMOGLOBIN (HGB A1C): Hemoglobin A1C: 6.2 % — AB (ref 4.0–5.6)

## 2023-12-07 MED ORDER — NOVOLIN 70/30 (70-30) 100 UNIT/ML ~~LOC~~ SUSP: SUBCUTANEOUS | 0 refills | Status: DC

## 2023-12-07 MED ORDER — SEMAGLUTIDE (1 MG/DOSE) 4 MG/3ML ~~LOC~~ SOPN: 1.0000 mg | PEN_INJECTOR | SUBCUTANEOUS | 0 refills | Status: DC

## 2023-12-07 MED ORDER — FREESTYLE LIBRE 3 PLUS SENSOR MISC
1 refills | Status: AC
Start: 2023-12-07 — End: ?

## 2023-12-07 NOTE — Progress Notes (Signed)
 Subjective:    Patient ID: Scott Rice, male    DOB: May 22, 1965, 58 y.o.   MRN: [id number]  Scott Rice is a very pleasant 58 y.o. male with a history of hypertension, type 2 diabetes, diabetic retinopathy, hyperlipidemia who presents today for follow-up of diabetes.  Current medications include: Novolin  70/30 40 units in AM and 30 units in PM, glipizide  10 mg BID, metformin  1000 mg BID, Ozempic  0.5 mg weekly.  Mild constipation with Ozempic  without having to take anything OTC.   Scott Rice is checking his blood glucose 2 times daily and is getting readings fasting of 120-130  Last A1C: 6.9 in June 2025, 6.2 today Last Eye Exam: UTD Last Foot Exam: UTD Pneumonia Vaccination: 2018 Urine Microalbumin: Due Statin: Atorvastatin    Dietary changes since last visit: Eating less restaurant and take out food. More home cooked meals. Scott Rice has increased water intake.    Exercise: Lifting weights 2 times weekly   BP Readings from Last 3 Encounters:  12/07/23 134/72  11/23/23 118/76  09/06/23 126/68   Wt Readings from Last 3 Encounters:  12/07/23 (!) 319 lb (144.7 kg)  11/23/23 (!) 350 lb (158.8 kg)  09/06/23 (!) 321 lb (145.6 kg)       Review of Systems  Respiratory:  Negative for shortness of breath.   Cardiovascular:  Negative for chest pain.  Gastrointestinal:  Positive for constipation.  Neurological:  Positive for numbness.         Past Medical History:  Diagnosis Date   Anxiety and depression    Cellulitis 02/24/2021   Cellulitis and abscess of neck 03/02/2021   Diabetic neuropathy [facility])    Essential hypertension    Frozen shoulder 07/20/2016   Hyperlipidemia    Type 2 diabetes mellitus (HCC)     Social History   Socioeconomic History   Marital status: Married    Spouse name: Not on file   Number of children: Not on file   Years of education: Not on file   Highest education level: Bachelor's degree (e.g., BA, AB, BS)  Occupational History   Not on file   Tobacco Use   Smoking status: Former   Smokeless tobacco: Never   Tobacco comments:    quit 40 years ago  Substance and Sexual Activity   Alcohol use: No   Drug use: No   Sexual activity: Not on file  Other Topics Concern   Not on file  Social History Narrative   Married.   No children.   Works as an Event organiser.   Enjoys watching movies, spending time with family.    Social Drivers of Health   Financial Resource Strain: Medium Risk (09/04/2023)   Overall Financial Resource Strain (CARDIA)    Difficulty of Paying Living Expenses: Somewhat hard  Food Insecurity: No Food Insecurity (09/04/2023)   Hunger Vital Sign    Worried About Running Out of Food in the Last Year: Never true    Ran Out of Food in the Last Year: Never true  Transportation Needs: No Transportation Needs (09/04/2023)   PRAPARE - Administrator, Civil Service (Medical): No    Lack of Transportation (Non-Medical): No  Physical Activity: Insufficiently Active (09/04/2023)   Exercise Vital Sign    Days of Exercise per Week: 2 days    Minutes of Exercise per Session: 30 min  Stress: Stress Concern Present (09/04/2023)   [employer] of Occupational Health - Occupational Stress Questionnaire    Feeling of Stress: To  some extent  Social Connections: Moderately Isolated (09/04/2023)   Social Connection and Isolation Panel    Frequency of Communication with Friends and Family: More than three times a week    Frequency of Social Gatherings with Friends and Family: Once a week    Attends Religious Services: Never    Database administrator or Organizations: No    Attends Engineer, structural: Not on file    Marital Status: Married  Catering manager Violence: Not At Risk (06/09/2023)   Humiliation, Afraid, Rape, and Kick questionnaire    Fear of Current or Ex-Partner: No    Emotionally Abused: No    Physically Abused: No    Sexually Abused: No    History reviewed. No pertinent  surgical history.  History reviewed. No pertinent family history.  Allergies  Allergen Reactions   Mixed Ragweed Cough and Other (See Comments)    Current Outpatient Medications on File Prior to Visit  Medication Sig Dispense Refill   acetaminophen (TYLENOL) 500 MG tablet Take 1,000 mg by mouth in the morning and at bedtime.     atorvastatin  (LIPITOR) 20 MG tablet TAKE 1 TABLET EVERY EVENING FOR CHOLESTEROL 90 tablet 2   B Complex-C (SUPER B COMPLEX PO) Take by mouth 2 (two) times daily.     busPIRone  (BUSPAR ) 5 MG tablet TAKE 1 TABLET TWICE DAILY FOR ANXIETY 180 tablet 2   Calcium  Carbonate-Vitamin D (CALCIUM  600/VITAMIN D PO) Take by mouth daily.     Diclofenac Sodium 1 % CREA Apply topically in the morning, at noon, and at bedtime.     famotidine (PEPCID) 20 MG tablet Take 20 mg by mouth daily.     FLUoxetine  (PROZAC ) 20 MG capsule TAKE 1 CAPSULE DAILY FOR ANXIETY AND DEPRESSION. TAKE WITH 40 MG DOSE. 90 capsule 2   FLUoxetine  (PROZAC ) 40 MG capsule TAKE 1 CAPSULE EVERY DAY FOR ANXIETY 90 capsule 2   gabapentin  (NEURONTIN ) 300 MG capsule TAKE 1 CAPSULE TWICE DAILY FOR NERVE PAIN 180 capsule 2   glipiZIDE  (GLUCOTROL ) 10 MG tablet TAKE 1 TABLET TWICE DAILY BEFORE MEALS FOR DIABETES 180 tablet 0   Insulin  Syringe-Needle U-100 (INSULIN  SYRINGE 1CC/30GX5/16) 30G X 5/16 1 ML MISC Inject twice daily with insulin . 200 each 3   levocetirizine (XYZAL ) 5 MG tablet Take 1 tablet (5 mg total) by mouth every evening. For allergies 90 tablet 3   lisinopril  (ZESTRIL ) 10 MG tablet TAKE 1 TABLET EVERY DAY FOR BLOOD PRESSURE 90 tablet 2   metFORMIN  (GLUCOPHAGE ) 1000 MG tablet TAKE 1 TABLET TWICE DAILY WITH MEALS FOR DIABETES 180 tablet 1   mupirocin  ointment (BACTROBAN ) 2 % APPLY 1 APPLICATION TOPICALLY 2 (TWO) TIMES DAILY. 22 g 0   solifenacin  (VESICARE ) 10 MG tablet Take 1 tablet (10 mg total) by mouth daily. 90 tablet 3   traZODone  (DESYREL ) 50 MG tablet TAKE 1/2 TO 1 TABLET  BY MOUTH AT BEDTIME AS  NEEDED FOR SLEEP. 90 tablet 0   No current facility-administered medications on file prior to visit.    BP 134/72   Pulse 98   Temp 98.3 F (36.8 C) (Temporal)   Ht 6' (1.829 m)   Wt (!) 319 lb (144.7 kg)   SpO2 95%   BMI 43.26 kg/m  Objective:   Physical Exam Cardiovascular:     Rate and Rhythm: Normal rate and regular rhythm.  Pulmonary:     Effort: Pulmonary effort is normal.     Breath sounds: Normal breath sounds.  Musculoskeletal:     Cervical back: Neck supple.  Skin:    General: Skin is warm and dry.  Neurological:     Mental Status: Scott Rice is alert and oriented to person, place, and time.  Psychiatric:        Mood and Affect: Mood normal.     Physical Exam        Assessment & Plan:  Type 2 diabetes mellitus with hyperglycemia, with long-term current use of insulin  (HCC) Assessment & Plan: Controlled and improved with A1C of 6.2 today!  Would like to eliminate some of his medications for pill burden and also prevention of hypoglycemic episodes.    Increase Ozempic  to 1 mg weekly for weight loss purposes and food cravings. Decrease Novolin  70/30 insulin  to 20 units in the morning and 15 units at night. Continue to monitor blood glucose.   Discussed the option for CGM, Freestyle Libre 3 plus ordered. Instructions provided for use.   Pneumonia vaccine provided today. Unable to provide sample for urine microalbumin  Follow up in 3 months.    I evaluated patient, was consulted regarding treatment, and agree with assessment and plan per Kristin Rudd, MSN, FNP student.   Mallie Gaskins, NP-C   Orders: -     POCT glycosylated hemoglobin (Hb A1C) -     Semaglutide  (1 MG/DOSE); Inject 1 mg as directed once a week. For diabetes.  Dispense: 12 mL; Refill: 0 -     FreeStyle Libre 3 Plus Sensor; Change sensor every 15 days.  Dispense: 6 each; Refill: 1  Type 2 diabetes mellitus with diabetic neuropathy, with long-term current use of insulin  (HCC) -     NovoLIN   70/30; Inject 20 units in the morning and 15 units at night for diabetes.  Dispense: 30 mL; Refill: 0    Assessment and Plan Assessment & Plan         Comer MARLA Gaskins, NP     History of Present Illness

## 2023-12-07 NOTE — Progress Notes (Signed)
   Established Patient Office Visit  Subjective   Patient ID: Scott Rice, male    DOB: 04-22-1965  Age: 58 y.o. MRN: [id number]  Chief Complaint  Patient presents with   Medical Management of Chronic Issues    po    HPI  [name] is a 58 year old male with a history of type 2 diabetes, diabetic neuropathy, and diabetic retinopathy that presents today for 3 month diabetes follow-up.   Current medications include: Glipizide  10 mg, Novolin  70/30 (40 units in the morning, 30 units before bed), Metformon 1000 mg BID, Semaglutide  0.5 mg. He is taking the 40 units in the morning an 30 units in the evening. He has had symptoms of low blood glucose with readings of 67 and 80.  He is checking his/her blood glucose 2 times daily and is getting readings of 100-120.   Last A1C: 6.9  09/06/23, 6.2 today Last Eye Exam: 8/25 Last Foot Exam: UTD Pneumonia Vaccination: 12/07/2023 Urine Microalbumin: Denies Statin: Atorvastatin  20 mg  Dietary changes since last visit: Eating more at home. Drinking more water, dry mouth. Some constipation, last BM yesterday.   Exercise: Weights 2 times a week and stretches for shoulder.   Review of Systems  Constitutional: Negative.   HENT: Negative.    Eyes: Negative.   Respiratory: Negative.    Cardiovascular: Negative.   Gastrointestinal:  Positive for constipation.  Musculoskeletal: Negative.   Skin: Negative.   Neurological:  Positive for sensory change.      Objective:     There were no vitals taken for this visit.   Physical Exam Cardiovascular:     Rate and Rhythm: Normal rate and regular rhythm.  Pulmonary:     Effort: Pulmonary effort is normal.     Breath sounds: Normal breath sounds.  Skin:    General: Skin is warm and dry.     Capillary Refill: Capillary refill takes less than 2 seconds.  Neurological:     Mental Status: He is alert and oriented to person, place, and time.      No results found for any visits on  12/07/23.    The ASCVD Risk score (Arnett DK, et al., 2019) failed to calculate for the following reasons:   The valid total cholesterol range is 130 to 320 mg/dL    Assessment & Plan:   Problem List Items Addressed This Visit       Endocrine   Type 2 diabetes mellitus with hyperglycemia (HCC) - Primary   Relevant Orders   POCT glycosylated hemoglobin (Hb A1C)    No follow-ups on file.    Meggan Dhaliwal, RN

## 2023-12-07 NOTE — Assessment & Plan Note (Addendum)
 Controlled and improved with A1C of 6.2 today!  Would like to eliminate some of his medications for pill burden and also prevention of hypoglycemic episodes.    Increase Ozempic  to 1 mg weekly for weight loss purposes and food cravings. Decrease Novolin  70/30 insulin  to 20 units in the morning and 15 units at night. Continue to monitor blood glucose.   Discussed the option for CGM, Freestyle Libre 3 plus ordered. Instructions provided for use.   Pneumonia vaccine provided today. Unable to provide sample for urine microalbumin  Follow up in 3 months.    I evaluated patient, was consulted regarding treatment, and agree with assessment and plan per Leroi Haque, MSN, FNP student.   Mallie Gaskins, NP-C

## 2023-12-07 NOTE — Patient Instructions (Addendum)
 Decrease your Novolin  insulin  dose to 20 units in the morning and 15 units at night.  Continue monitoring blood glucose regularly.   Increase Ozepmic dose to 1 mg weekly.  Follow-up in 3 months.

## 2023-12-22 DIAGNOSIS — E113393 Type 2 diabetes mellitus with moderate nonproliferative diabetic retinopathy without macular edema, bilateral: Secondary | ICD-10-CM | POA: Diagnosis not present

## 2023-12-27 ENCOUNTER — Ambulatory Visit: Admitting: Clinical

## 2024-01-15 ENCOUNTER — Other Ambulatory Visit: Payer: Self-pay | Admitting: Primary Care

## 2024-01-15 DIAGNOSIS — E119 Type 2 diabetes mellitus without complications: Secondary | ICD-10-CM

## 2024-01-25 ENCOUNTER — Other Ambulatory Visit: Payer: Self-pay | Admitting: Primary Care

## 2024-01-25 DIAGNOSIS — E119 Type 2 diabetes mellitus without complications: Secondary | ICD-10-CM

## 2024-01-26 ENCOUNTER — Encounter: Payer: Self-pay | Admitting: Pharmacist

## 2024-01-26 DIAGNOSIS — E113313 Type 2 diabetes mellitus with moderate nonproliferative diabetic retinopathy with macular edema, bilateral: Secondary | ICD-10-CM | POA: Diagnosis not present

## 2024-01-26 DIAGNOSIS — H43813 Vitreous degeneration, bilateral: Secondary | ICD-10-CM | POA: Diagnosis not present

## 2024-01-26 DIAGNOSIS — Z961 Presence of intraocular lens: Secondary | ICD-10-CM | POA: Diagnosis not present

## 2024-01-26 NOTE — Progress Notes (Signed)
 Pharmacy Quality Measure Review  This patient is appearing on a report for being at risk of failing the Kidney Health Evaluation for Patients with Diabetes measure this calendar year.   Last documented UACR: N/A  GFR last obtained: 2025 Lab Results  Component Value Date   GFR 86.06 06/01/2023   At last PCP visit, patient unable to provide sample for UACR  Next office visit scheduled 12/30. Encounter note placed for reminder to collect UACR.    Future Appointments  Date Time Provider Department Center  03/01/2024  1:30 PM Sherrine Pao, LCSW [facility] None  03/13/2024 11:00 AM Gretta Comer POUR, NP [facility] [address]  06/12/2024  1:40 PM [facility] ANNUAL WELLNESS VISIT 1 [facility] [address]  11/27/2024  3:00 PM Stoioff, Glendia BROCKS, MD [facility] None

## 2024-01-31 ENCOUNTER — Ambulatory Visit: Admitting: Clinical

## 2024-03-01 ENCOUNTER — Ambulatory Visit: Admitting: Clinical

## 2024-03-13 ENCOUNTER — Encounter: Payer: Self-pay | Admitting: Primary Care

## 2024-03-13 ENCOUNTER — Ambulatory Visit: Admitting: Primary Care

## 2024-03-13 ENCOUNTER — Ambulatory Visit: Payer: Self-pay | Admitting: Primary Care

## 2024-03-13 VITALS — BP 118/62 | HR 89 | Temp 97.7°F | Ht 72.0 in | Wt 298.1 lb

## 2024-03-13 DIAGNOSIS — E1165 Type 2 diabetes mellitus with hyperglycemia: Secondary | ICD-10-CM | POA: Diagnosis not present

## 2024-03-13 DIAGNOSIS — Z794 Long term (current) use of insulin: Secondary | ICD-10-CM

## 2024-03-13 DIAGNOSIS — E1140 Type 2 diabetes mellitus with diabetic neuropathy, unspecified: Secondary | ICD-10-CM | POA: Diagnosis not present

## 2024-03-13 LAB — MICROALBUMIN / CREATININE URINE RATIO
Creatinine,U: 69.9 mg/dL
Microalb Creat Ratio: UNDETERMINED mg/g (ref 0.0–30.0)
Microalb, Ur: <0.7 mg/dL

## 2024-03-13 LAB — POCT GLYCOSYLATED HEMOGLOBIN (HGB A1C): Hemoglobin A1C: 5.7 % — AB (ref 4.0–5.6)

## 2024-03-13 MED ORDER — SEMAGLUTIDE (1 MG/DOSE) 4 MG/3ML ~~LOC~~ SOPN: 1.0000 mg | PEN_INJECTOR | SUBCUTANEOUS | 1 refills | Status: AC

## 2024-03-13 MED ORDER — NOVOLIN 70/30 (70-30) 100 UNIT/ML ~~LOC~~ SUSP: SUBCUTANEOUS | Status: AC

## 2024-03-13 NOTE — Progress Notes (Signed)
 "  Subjective:    Patient ID: Scott Rice, male    DOB: 04-09-1965, 58 y.o.   MRN: [id number]  Scott Rice is a very pleasant 58 y.o. male with a history of hypertension, type 2 diabetes, diabetic neuropathy, diabetic retinopathy, chronic back pain who presents today for follow up of diabetes.   1) Type 2 Diabetes:  Current medications include: Metformin  1000 mg twice daily, Ozempic  1 mg weekly, glipizide  10 mg twice daily, Novolin  70/30 20 units in a.m. and 15 units at bedtime  He is checking his blood glucose 1-2 times daily and is getting readings ranging low 100s.   Lowest reading of 67. He did experience symptoms with this reading.   Last A1C: 6.2 in September 2025, 5.7 today Last Eye Exam: Up-to-date Last Foot Exam: Due Pneumonia Vaccination: 2025 Urine Microalbumin: Due Statin: Atorvastatin   Dietary changes since last visit: Baked chicken, home cooked meals, reduced intake of candy, eating less overall.    Exercise: None.  BP Readings from Last 3 Encounters:  03/13/24 118/62  12/07/23 134/72  11/23/23 118/76   Wt Readings from Last 3 Encounters:  03/13/24 298 lb 2 oz (135.2 kg)  12/07/23 (!) 319 lb (144.7 kg)  11/23/23 (!) 350 lb (158.8 kg)      Review of Systems  Respiratory:  Negative for shortness of breath.   Cardiovascular:  Negative for chest pain.  Neurological:  Negative for numbness.         Past Medical History:  Diagnosis Date   Anxiety and depression    Cellulitis 02/24/2021   Cellulitis and abscess of neck 03/02/2021   Diabetic neuropathy [facility])    Essential hypertension    Frozen shoulder 07/20/2016   Hyperlipidemia    Type 2 diabetes mellitus (HCC)     Social History   Socioeconomic History   Marital status: Married    Spouse name: Not on file   Number of children: Not on file   Years of education: Not on file   Highest education level: Bachelor's degree (e.g., BA, AB, BS)  Occupational History   Not on file  Tobacco  Use   Smoking status: Former   Smokeless tobacco: Never   Tobacco comments:    quit 40 years ago  Substance and Sexual Activity   Alcohol use: No   Drug use: No   Sexual activity: Not on file  Other Topics Concern   Not on file  Social History Narrative   Married.   No children.   Works as an event organiser.   Enjoys watching movies, spending time with family.    Social Drivers of Health   Tobacco Use: Medium Risk (03/13/2024)   Patient History    Smoking Tobacco Use: Former    Smokeless Tobacco Use: Never    Passive Exposure: Not on file  Financial Resource Strain: Medium Risk (03/11/2024)   Overall Financial Resource Strain (CARDIA)    Difficulty of Paying Living Expenses: Somewhat hard  Food Insecurity: Food Insecurity Present (03/11/2024)   Epic    Worried About Programme Researcher, Broadcasting/film/video in the Last Year: Sometimes true    Ran Out of Food in the Last Year: Never true  Transportation Needs: No Transportation Needs (03/11/2024)   Epic    Lack of Transportation (Medical): No    Lack of Transportation (Non-Medical): No  Physical Activity: Insufficiently Active (03/11/2024)   Exercise Vital Sign    Days of Exercise per Week: 2 days    Minutes of Exercise  per Session: 30 min  Stress: Stress Concern Present (03/11/2024)   [employer] of Occupational Health - Occupational Stress Questionnaire    Feeling of Stress: To some extent  Social Connections: Moderately Isolated (03/11/2024)   Social Connection and Isolation Panel    Frequency of Communication with Friends and Family: Once a week    Frequency of Social Gatherings with Friends and Family: Twice a week    Attends Religious Services: Never    Database Administrator or Organizations: No    Attends Engineer, Structural: Not on file    Marital Status: Married  Catering Manager Violence: Not At Risk (06/09/2023)   Humiliation, Afraid, Rape, and Kick questionnaire    Fear of Current or Ex-Partner: No     Emotionally Abused: No    Physically Abused: No    Sexually Abused: No  Depression (PHQ2-9): Low Risk (03/13/2024)   Depression (PHQ2-9)    PHQ-2 Score: 0  Alcohol Screen: Low Risk (03/11/2024)   Alcohol Screen    Last Alcohol Screening Score (AUDIT): 0  Housing: Low Risk (03/11/2024)   Epic    Unable to Pay for Housing in the Last Year: No    Number of Times Moved in the Last Year: 0    Homeless in the Last Year: No  Utilities: Not At Risk (06/09/2023)   [facility] Utilities    Threatened with loss of utilities: No  Health Literacy: Adequate Health Literacy (06/09/2023)   B1300 Health Literacy    Frequency of need for help with medical instructions: Never    History reviewed. No pertinent surgical history.  History reviewed. No pertinent family history.  Allergies[1]  Medications Ordered Prior to Encounter[2]  BP 118/62   Pulse 89   Temp 97.7 F (36.5 C) (Oral)   Ht 6' (1.829 m)   Wt 298 lb 2 oz (135.2 kg)   SpO2 98%   BMI 40.43 kg/m  Objective:   Physical Exam Cardiovascular:     Rate and Rhythm: Normal rate and regular rhythm.  Pulmonary:     Effort: Pulmonary effort is normal.     Breath sounds: Normal breath sounds.  Musculoskeletal:     Cervical back: Neck supple.  Skin:    General: Skin is warm and dry.  Neurological:     Mental Status: He is alert and oriented to person, place, and time.  Psychiatric:        Mood and Affect: Mood normal.     Physical Exam        Assessment & Plan:  Type 2 diabetes mellitus with hyperglycemia, with long-term current use of insulin  (HCC) Assessment & Plan: Improved and well controlled with A1C of 5.7 today!!!  Recommended a reduction in his insulin  for which he is reticent but agrees. Reduce Novolin  70/30 to 15 units twice daily.  Continue glipizide  10 mg twice daily, metformin  1000 mg twice daily, Ozempic  1 mg weekly for now.  Foot exam today. Urine microalbumin pending.  Follow-up in April 2026 for  CPE  Orders: -     POCT glycosylated hemoglobin (Hb A1C) -     Microalbumin / creatinine urine ratio -     Semaglutide  (1 MG/DOSE); Inject 1 mg as directed once a week. For diabetes.  Dispense: 9 mL; Refill: 1  Type 2 diabetes mellitus with diabetic neuropathy, with long-term current use of insulin  (HCC) -     NovoLIN  70/30; Inject 15 units in the morning and 15 units at night for  diabetes.    Assessment and Plan Assessment & Plan         Comer MARLA Gaskins, NP       [1]  Allergies Allergen Reactions   Mixed Ragweed Cough and Other (See Comments)  [2]  Current Outpatient Medications on File Prior to Visit  Medication Sig Dispense Refill   acetaminophen (TYLENOL) 500 MG tablet Take 1,000 mg by mouth in the morning and at bedtime.     atorvastatin  (LIPITOR) 20 MG tablet TAKE 1 TABLET EVERY EVENING FOR CHOLESTEROL 90 tablet 2   B Complex-C (SUPER B COMPLEX PO) Take by mouth 2 (two) times daily.     busPIRone  (BUSPAR ) 5 MG tablet TAKE 1 TABLET TWICE DAILY FOR ANXIETY 180 tablet 2   Calcium  Carbonate-Vitamin D (CALCIUM  600/VITAMIN D PO) Take by mouth daily.     Continuous Glucose Sensor (FREESTYLE LIBRE 3 PLUS SENSOR) MISC Change sensor every 15 days. 6 each 1   Diclofenac Sodium 1 % CREA Apply topically in the morning, at noon, and at bedtime.     famotidine (PEPCID) 20 MG tablet Take 20 mg by mouth daily.     FLUoxetine  (PROZAC ) 20 MG capsule TAKE 1 CAPSULE DAILY FOR ANXIETY AND DEPRESSION. TAKE WITH 40 MG DOSE. 90 capsule 2   FLUoxetine  (PROZAC ) 40 MG capsule TAKE 1 CAPSULE EVERY DAY FOR ANXIETY 90 capsule 2   gabapentin  (NEURONTIN ) 300 MG capsule TAKE 1 CAPSULE TWICE DAILY FOR NERVE PAIN 180 capsule 2   glipiZIDE  (GLUCOTROL ) 10 MG tablet TAKE 1 TABLET TWICE DAILY BEFORE MEALS FOR DIABETES 180 tablet 1   Insulin  Syringe-Needle U-100 (INSULIN  SYRINGE 1CC/30GX5/16) 30G X 5/16 1 ML MISC Inject twice daily with insulin . 200 each 3   levocetirizine (XYZAL ) 5 MG tablet Take 1  tablet (5 mg total) by mouth every evening. For allergies 90 tablet 3   lisinopril  (ZESTRIL ) 10 MG tablet TAKE 1 TABLET EVERY DAY FOR BLOOD PRESSURE 90 tablet 2   metFORMIN  (GLUCOPHAGE ) 1000 MG tablet TAKE 1 TABLET TWICE DAILY WITH MEALS FOR DIABETES 180 tablet 1   mupirocin  ointment (BACTROBAN ) 2 % APPLY 1 APPLICATION TOPICALLY 2 (TWO) TIMES DAILY. 22 g 0   solifenacin  (VESICARE ) 10 MG tablet Take 1 tablet (10 mg total) by mouth daily. 90 tablet 3   traZODone  (DESYREL ) 50 MG tablet TAKE 1/2 TO 1 TABLET  BY MOUTH AT BEDTIME AS NEEDED FOR SLEEP. 90 tablet 0   Vitamin D-Vitamin K (VITAMIN K2-VITAMIN D3 PO) Take by mouth daily.     No current facility-administered medications on file prior to visit.   "

## 2024-03-13 NOTE — Patient Instructions (Signed)
 Please schedule a physical to meet with me after April

## 2024-03-13 NOTE — Assessment & Plan Note (Signed)
 Improved and well controlled with A1C of 5.7 today!!!  Recommended a reduction in his insulin  for which he is reticent but agrees. Reduce Novolin  70/30 to 15 units twice daily.  Continue glipizide  10 mg twice daily, metformin  1000 mg twice daily, Ozempic  1 mg weekly for now.  Foot exam today. Urine microalbumin pending.  Follow-up in April 2026 for CPE

## 2024-04-05 ENCOUNTER — Other Ambulatory Visit: Payer: Self-pay | Admitting: Medical Genetics

## 2024-04-05 DIAGNOSIS — Z006 Encounter for examination for normal comparison and control in clinical research program: Secondary | ICD-10-CM

## 2024-06-12 ENCOUNTER — Ambulatory Visit

## 2024-07-10 ENCOUNTER — Encounter: Admitting: Primary Care

## 2024-11-27 ENCOUNTER — Ambulatory Visit: Admitting: Urology
